# Patient Record
Sex: Female | Born: 1950 | Race: White | Hispanic: No | Marital: Married | Smoking: Never smoker
Health system: Southern US, Community
[De-identification: ages and names within clinical notes are randomized; demographics above are authoritative.]

## PROBLEM LIST (undated history)

## (undated) DIAGNOSIS — G4730 Sleep apnea, unspecified: Secondary | ICD-10-CM

## (undated) DIAGNOSIS — K219 Gastro-esophageal reflux disease without esophagitis: Secondary | ICD-10-CM

## (undated) DIAGNOSIS — E7800 Pure hypercholesterolemia, unspecified: Secondary | ICD-10-CM

## (undated) DIAGNOSIS — J309 Allergic rhinitis, unspecified: Secondary | ICD-10-CM

## (undated) DIAGNOSIS — H269 Unspecified cataract: Secondary | ICD-10-CM

## (undated) DIAGNOSIS — M8580 Other specified disorders of bone density and structure, unspecified site: Secondary | ICD-10-CM

## (undated) HISTORY — DX: Gastro-esophageal reflux disease without esophagitis: K21.9

## (undated) HISTORY — DX: Unspecified cataract: H26.9

## (undated) HISTORY — DX: Pure hypercholesterolemia, unspecified: E78.00

## (undated) HISTORY — DX: Sleep apnea, unspecified: G47.30

## (undated) HISTORY — DX: Allergic rhinitis, unspecified: J30.9

## (undated) HISTORY — DX: Other specified disorders of bone density and structure, unspecified site: M85.80

---

## 1999-01-26 ENCOUNTER — Other Ambulatory Visit: Admission: RE | Admit: 1999-01-26 | Discharge: 1999-01-26 | Payer: Self-pay | Admitting: Family Medicine

## 1999-03-27 ENCOUNTER — Encounter: Admission: RE | Admit: 1999-03-27 | Discharge: 1999-03-27 | Payer: Self-pay | Admitting: Family Medicine

## 1999-03-27 ENCOUNTER — Encounter: Payer: Self-pay | Admitting: Family Medicine

## 2000-02-20 ENCOUNTER — Other Ambulatory Visit: Admission: RE | Admit: 2000-02-20 | Discharge: 2000-02-20 | Payer: Self-pay | Admitting: *Deleted

## 2000-05-13 ENCOUNTER — Encounter: Admission: RE | Admit: 2000-05-13 | Discharge: 2000-05-13 | Payer: Self-pay | Admitting: Emergency Medicine

## 2000-05-13 ENCOUNTER — Encounter: Payer: Self-pay | Admitting: Emergency Medicine

## 2000-05-19 ENCOUNTER — Encounter: Payer: Self-pay | Admitting: Emergency Medicine

## 2000-05-19 ENCOUNTER — Encounter: Admission: RE | Admit: 2000-05-19 | Discharge: 2000-05-19 | Payer: Self-pay | Admitting: Emergency Medicine

## 2001-02-11 ENCOUNTER — Ambulatory Visit (HOSPITAL_COMMUNITY): Admission: RE | Admit: 2001-02-11 | Discharge: 2001-02-11 | Payer: Self-pay | Admitting: Internal Medicine

## 2001-08-25 ENCOUNTER — Encounter: Admission: RE | Admit: 2001-08-25 | Discharge: 2001-08-25 | Payer: Self-pay | Admitting: *Deleted

## 2002-08-19 ENCOUNTER — Encounter: Admission: RE | Admit: 2002-08-19 | Discharge: 2002-08-19 | Payer: Self-pay | Admitting: Family Medicine

## 2002-08-19 ENCOUNTER — Encounter: Payer: Self-pay | Admitting: Family Medicine

## 2003-01-21 ENCOUNTER — Other Ambulatory Visit: Admission: RE | Admit: 2003-01-21 | Discharge: 2003-01-21 | Payer: Self-pay | Admitting: Obstetrics and Gynecology

## 2003-02-14 ENCOUNTER — Encounter: Admission: RE | Admit: 2003-02-14 | Discharge: 2003-02-14 | Payer: Self-pay | Admitting: Family Medicine

## 2003-09-02 ENCOUNTER — Encounter: Admission: RE | Admit: 2003-09-02 | Discharge: 2003-09-02 | Payer: Self-pay | Admitting: Family Medicine

## 2004-01-24 ENCOUNTER — Other Ambulatory Visit: Admission: RE | Admit: 2004-01-24 | Discharge: 2004-01-24 | Payer: Self-pay | Admitting: Obstetrics and Gynecology

## 2004-09-10 ENCOUNTER — Encounter: Admission: RE | Admit: 2004-09-10 | Discharge: 2004-09-10 | Payer: Self-pay | Admitting: Family Medicine

## 2005-01-25 ENCOUNTER — Other Ambulatory Visit: Admission: RE | Admit: 2005-01-25 | Discharge: 2005-01-25 | Payer: Self-pay | Admitting: Obstetrics and Gynecology

## 2005-09-11 ENCOUNTER — Encounter: Admission: RE | Admit: 2005-09-11 | Discharge: 2005-09-11 | Payer: Self-pay | Admitting: Obstetrics and Gynecology

## 2006-09-10 ENCOUNTER — Encounter: Admission: RE | Admit: 2006-09-10 | Discharge: 2006-09-10 | Payer: Self-pay | Admitting: Obstetrics and Gynecology

## 2007-09-02 ENCOUNTER — Encounter: Admission: RE | Admit: 2007-09-02 | Discharge: 2007-09-02 | Payer: Self-pay | Admitting: Family Medicine

## 2008-07-25 LAB — HM COLONOSCOPY: HM Colonoscopy: NORMAL

## 2008-09-01 ENCOUNTER — Encounter: Admission: RE | Admit: 2008-09-01 | Discharge: 2008-09-01 | Payer: Self-pay | Admitting: Obstetrics and Gynecology

## 2009-09-04 ENCOUNTER — Encounter: Admission: RE | Admit: 2009-09-04 | Discharge: 2009-09-04 | Payer: Self-pay | Admitting: Obstetrics and Gynecology

## 2010-08-02 ENCOUNTER — Other Ambulatory Visit: Payer: Self-pay | Admitting: Obstetrics and Gynecology

## 2010-08-02 DIAGNOSIS — Z1231 Encounter for screening mammogram for malignant neoplasm of breast: Secondary | ICD-10-CM

## 2010-09-18 ENCOUNTER — Ambulatory Visit
Admission: RE | Admit: 2010-09-18 | Discharge: 2010-09-18 | Disposition: A | Discharge status: Home / Self Care | Payer: 59 | Source: Ambulatory Visit | Attending: Obstetrics and Gynecology | Admitting: Obstetrics and Gynecology

## 2010-09-18 DIAGNOSIS — Z1231 Encounter for screening mammogram for malignant neoplasm of breast: Secondary | ICD-10-CM

## 2010-09-18 LAB — HM MAMMOGRAPHY: HM Mammogram: NEGATIVE

## 2011-05-09 ENCOUNTER — Encounter: Payer: Self-pay | Admitting: Family Medicine

## 2011-05-09 ENCOUNTER — Ambulatory Visit (INDEPENDENT_AMBULATORY_CARE_PROVIDER_SITE_OTHER): Payer: 59 | Admitting: Family Medicine

## 2011-05-09 VITALS — BP 122/80 | HR 80 | Temp 98.7°F | Ht 63.25 in | Wt 165.0 lb

## 2011-05-09 DIAGNOSIS — E7800 Pure hypercholesterolemia, unspecified: Secondary | ICD-10-CM

## 2011-05-09 DIAGNOSIS — B354 Tinea corporis: Secondary | ICD-10-CM

## 2011-05-09 MED ORDER — CLOTRIMAZOLE 1 % EX CREA: TOPICAL_CREAM | Freq: Two times a day (BID) | CUTANEOUS | Status: AC

## 2011-05-09 NOTE — Patient Instructions (Signed)
Increase clotrimazole to using it twice daily.  You likely will need to use it for up to 2-3 weeks.  Try and avoid irritation as much as possible (exercise clothing), and keep as dry as possible

## 2011-05-09 NOTE — Progress Notes (Signed)
Chief complaint:  rash in inner thigh, kinda looks like a bruise. been there 5-6 weeks    HPI:  Noticed a "bruise" on L leg about 5-6 weeks, but it wasn't sore like a bruise.  After a couple of weeks, when it didn't go away, she realized it wasn't a bruise.  She had a fungal infection 2 years ago that started out the same way.  She started using an OTC antifungal cream once daily 3 weeks ago, but hasn't noticed any improvement.  Not changing in size. She continues to use baby powder before exercise since the last infection.  Started noticing a similar lesion on the right leg about 3 weeks ago, which has gotten a little larger.  Not pruritic.    Last CPE in January with GYN, who also did her labs and prescribes her Lipitor.    Past Medical History  Diagnosis Date  . Pure hypercholesterolemia   . GERD (gastroesophageal reflux disease)   . Allergic rhinitis, cause unspecified     seasonal    History reviewed. No pertinent past surgical history.  History   Social History  . Marital Status: Married    Spouse Name: N/A    Number of Children: N/A  . Years of Education: N/A   Occupational History  . benefits [employer]   Social History Main Topics  . Smoking status: Never Smoker   . Smokeless tobacco: Never Used  . Alcohol Use: Yes     1 drink a week  . Drug Use: No  . Sexually Active: Yes    Birth Control/ Protection: Post-menopausal   Other Topics Concern  . Not on file   Social History Narrative   Lives with husband.  2 sons locally ([place] and [employer]), 1 grandchild.  Plans to retire in 4 months.    Family History  Problem Relation Age of Onset  . Hyperlipidemia Mother   . Hypothyroidism Mother   . Parkinsonism Mother   . Colon polyps Mother   . Heart disease Father   . Depression Father   . Hypothyroidism Sister   . Allergies Brother   . Cancer Maternal Grandmother     in her back (bone?)  . Heart disease Maternal Grandfather   . Heart disease Paternal  Grandmother   . Diabetes Paternal Grandmother   . Heart disease Paternal Grandfather   . Hyperlipidemia Brother   . Colon polyps Brother   . Colon polyps Sister     Current outpatient prescriptions:atorvastatin (LIPITOR) 10 MG tablet, Take 10 mg by mouth daily., Disp: , Rfl: ;  Calcium Carbonate-Vitamin D (CALCIUM 500 + D PO), Take by mouth., Disp: , Rfl: ;  cetirizine (ZYRTEC) 10 MG tablet, Take 10 mg by mouth daily., Disp: , Rfl: ;  dextromethorphan (DELSYM) 30 MG/5ML liquid, Take 60 mg by mouth as needed., Disp: , Rfl:  esomeprazole (NEXIUM) 40 MG capsule, Take 40 mg by mouth daily before breakfast., Disp: , Rfl: ;  estradiol-norethindrone (ACTIVELLA) 1-0.5 MG per tablet, Take 1 tablet by mouth daily., Disp: , Rfl: ;  IRON PO, Take 500 mg by mouth., Disp: , Rfl: ;  vitamin C (ASCORBIC ACID) 500 MG tablet, Take 500 mg by mouth daily., Disp: , Rfl: ;  clotrimazole (LOTRIMIN) 1 % cream, Apply topically 2 (two) times daily., Disp: 45 g, Rfl: 1  Allergies no known allergies  ROS:  Denies headaches, myalgias, chest pain, fevers, URI symptoms, bleeding/bruising, other skin lesions or other concerns  PHYSICAL EXAM:  BP 122/80  Pulse 80  Temp(Src) 98.7 F (37.1 C) (Oral)  Ht 5' 3.25" (1.607 m)  Wt 165 lb (74.844 kg)  BMI 29.00 kg/m2 Well developed, pleasant female in no distress Skin: L inner thigh--area of mild erythema, slightly raised and hyperpigmentation measuring 5 cm by 1-1.5. On R inner thigh, just slightly superior to the lesion on left (not exactly "kissing lesions", there is similarly appearing lesions, but smaller, measuring 3.5 x 1 cm.  No central clearing, no satellite pustules.  Remainder of skin is normal  Neck: no lymphadenopathy Heart: regular rate and rhythm without murmur Lungs: clear bilaterally  ASSESSMENT/PLAN: 1. Tinea corporis  clotrimazole (LOTRIMIN) 1 % cream  2. Pure hypercholesterolemia     This doesn't look like classic fungal infection, but given  location/similar history in past, must suspect this.  Doesn't look typical due to partial treatment with antifungals. Therefore, full course of OTC treatment recommended (BID x 2-3 weeks).  F/u if persists/worsens  Briefly reviewed immunization recommendations--check TdaP (vs Td), and recommend zostavax at 60

## 2011-08-14 ENCOUNTER — Other Ambulatory Visit: Payer: Self-pay | Admitting: Obstetrics and Gynecology

## 2011-08-14 DIAGNOSIS — Z1231 Encounter for screening mammogram for malignant neoplasm of breast: Secondary | ICD-10-CM

## 2011-09-25 ENCOUNTER — Ambulatory Visit
Admission: RE | Admit: 2011-09-25 | Discharge: 2011-09-25 | Disposition: A | Discharge status: Home / Self Care | Payer: 59 | Source: Ambulatory Visit | Attending: Obstetrics and Gynecology | Admitting: Obstetrics and Gynecology

## 2011-09-25 DIAGNOSIS — Z1231 Encounter for screening mammogram for malignant neoplasm of breast: Secondary | ICD-10-CM

## 2011-09-26 ENCOUNTER — Other Ambulatory Visit: Payer: Self-pay | Admitting: Obstetrics and Gynecology

## 2011-09-26 DIAGNOSIS — R928 Other abnormal and inconclusive findings on diagnostic imaging of breast: Secondary | ICD-10-CM

## 2011-09-27 ENCOUNTER — Ambulatory Visit
Admission: RE | Admit: 2011-09-27 | Discharge: 2011-09-27 | Disposition: A | Discharge status: Home / Self Care | Payer: 59 | Source: Ambulatory Visit | Attending: Obstetrics and Gynecology | Admitting: Obstetrics and Gynecology

## 2011-09-27 ENCOUNTER — Other Ambulatory Visit: Payer: Self-pay | Admitting: Obstetrics and Gynecology

## 2011-09-27 DIAGNOSIS — R928 Other abnormal and inconclusive findings on diagnostic imaging of breast: Secondary | ICD-10-CM

## 2011-10-01 ENCOUNTER — Encounter: Payer: Self-pay | Admitting: Internal Medicine

## 2011-10-09 ENCOUNTER — Other Ambulatory Visit (INDEPENDENT_AMBULATORY_CARE_PROVIDER_SITE_OTHER): Payer: 59

## 2011-10-09 DIAGNOSIS — Z23 Encounter for immunization: Secondary | ICD-10-CM

## 2012-02-17 ENCOUNTER — Other Ambulatory Visit: Payer: Self-pay | Admitting: Obstetrics and Gynecology

## 2012-02-17 DIAGNOSIS — N6009 Solitary cyst of unspecified breast: Secondary | ICD-10-CM

## 2012-03-27 ENCOUNTER — Ambulatory Visit
Admission: RE | Admit: 2012-03-27 | Discharge: 2012-03-27 | Disposition: A | Discharge status: Home / Self Care | Payer: BC Managed Care – PPO | Source: Ambulatory Visit | Attending: Obstetrics and Gynecology | Admitting: Obstetrics and Gynecology

## 2012-03-27 DIAGNOSIS — N6009 Solitary cyst of unspecified breast: Secondary | ICD-10-CM

## 2012-06-11 ENCOUNTER — Encounter: Payer: Self-pay | Admitting: Family Medicine

## 2012-06-11 ENCOUNTER — Ambulatory Visit (INDEPENDENT_AMBULATORY_CARE_PROVIDER_SITE_OTHER): Payer: BC Managed Care – PPO | Admitting: Family Medicine

## 2012-06-11 VITALS — BP 128/80 | HR 68 | Temp 98.1°F | Ht 63.5 in | Wt 162.0 lb

## 2012-06-11 DIAGNOSIS — J309 Allergic rhinitis, unspecified: Secondary | ICD-10-CM

## 2012-06-11 DIAGNOSIS — J069 Acute upper respiratory infection, unspecified: Secondary | ICD-10-CM

## 2012-06-11 MED ORDER — AMOXICILLIN 875 MG PO TABS: 875.0000 mg | ORAL_TABLET | Freq: Two times a day (BID) | ORAL | Status: DC

## 2012-06-11 MED ORDER — GUAIFENESIN ER 1200 MG PO TB12: 1.0000 | ORAL_TABLET | Freq: Two times a day (BID) | ORAL | Status: DC | PRN

## 2012-06-11 NOTE — Progress Notes (Signed)
Chief Complaint  Patient presents with  . cold    head cold and cough, coughing bringing up yellow mucous sometimes for about 2 weeks   13 days ago she started with coughing and sneezing.  Then developed sinus headaches, blowing her nose a lot.  It started to loosen up, but hasn't resolved.  She is using sudafed during day, nyquil at night.  Sinus headaches resolved. Nasal mucus is clear.  Coughing up phlegm all day long, yellow in color, not particularly thick.  Denies shortness of breath, tightness or wheezing, no chest pain.  Denies fevers.  Denies sick contacts. Her eye allergies have been bothering her, using drops, but not taking any zyrtec.  She had similar illness this time last year, but it didn't last as long.  Past Medical History  Diagnosis Date  . Pure hypercholesterolemia   . GERD (gastroesophageal reflux disease)   . Allergic rhinitis, cause unspecified     seasonal   History reviewed. No pertinent past surgical history. History   Social History  . Marital Status: Married    Spouse Name: N/A    Number of Children: N/A  . Years of Education: N/A   Occupational History  . 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 Smoking status: Never Smoker   . Smokeless tobacco: Never Used  . Alcohol Use: Yes     Comment: 1 drink a week  . Drug Use: No  . Sexually Active: Yes    Birth Control/ Protection: Post-menopausal   Other Topics Concern  . Not on file   Social History Narrative   Lives with husband.  2 sons locally ([place] and [employer]), 1 grandchild.  Retired, but working part-time at [employer].  Husband is a smoker, but he doesn't smoke in car or in house.   Current Outpatient Prescriptions on File Prior to Visit  Medication Sig Dispense Refill  . atorvastatin (LIPITOR) 10 MG tablet Take 10 mg by mouth daily.      . Calcium Carbonate-Vitamin D (CALCIUM 500 + D PO) Take by mouth.      . dextromethorphan (DELSYM) 30 MG/5ML liquid  Take 60 mg by mouth as needed.      Marland Kitchen esomeprazole (NEXIUM) 40 MG capsule Take 40 mg by mouth daily before breakfast.      . estradiol-norethindrone (ACTIVELLA) 1-0.5 MG per tablet Take 1 tablet by mouth daily.      . IRON PO Take 500 mg by mouth.      . vitamin C (ASCORBIC ACID) 500 MG tablet Take 500 mg by mouth daily.      . cetirizine (ZYRTEC) 10 MG tablet Take 10 mg by mouth daily.       No current facility-administered medications on file prior to visit.   No Known Allergies  ROS: denies fevers, headaches, chest pain, shortness of breath, nausea, vomiting, diarrhea, bowel changes, urinary complaints, skin rashes, bleeding/bruising or other complaints.  See HPI.  Denies reflux currently  PHYSICAL EXAM: BP 128/80  Pulse 68  Temp(Src) 98.1 F (36.7 C) (Oral)  Ht 5' 3.5" (1.613 m)  Wt 162 lb (73.483 kg)  BMI 28.24 kg/m2 Well developed female, in no distress.  No significant cough/sniffling/congestion noted HEENT:  PERRL, EOMI, conjunctiva clear.  TM's and EAC's normal.  Nasal mucosa moderately edematous, not pale or particularly red.  Clear mucus with slight yellow crust at nares opening.  Sinuses nontender.  OP clear Neck: no lymphadenopathy or mass Heart:  regular rate and rhythm without murmur Lungs: clear bilaterally.  Good air movement. No wheezes, rales or ronchi Skin: no rash Neuro: alert and oriented. Cranial nerves intact.  Normal gait.  ASSESSMENT/PLAN:  Acute upper respiratory infections of unspecified site - Plan: amoxicillin (AMOXIL) 875 MG tablet, Guaifenesin 1200 MG TB12  Allergic rhinitis, cause unspecified  URI vs allergies.  Given length of symptoms, but not progressive, likely has underlying allergy component.  Add in Zyrtec daily.  Try using mucinex twice daily (and delsym syrup just as needed for cough).  Consider sinus rinses if having increasing sinus pressure/pain.  If discolored phlegm persists, start (and finish) antibiotics

## 2012-06-11 NOTE — Patient Instructions (Signed)
  URI (cold) vs allergies.  Given length of symptoms, but not progressive, likely has underlying allergy component.  Add in Zyrtec daily.  Try using mucinex twice daily (and delsym syrup just as needed for cough).  Consider sinus rinses if having increasing sinus pressure/pain.  If discolored phlegm persists, start (and finish) antibiotics

## 2012-08-17 ENCOUNTER — Other Ambulatory Visit: Payer: Self-pay

## 2012-08-17 DIAGNOSIS — Z1231 Encounter for screening mammogram for malignant neoplasm of breast: Secondary | ICD-10-CM

## 2012-09-30 ENCOUNTER — Ambulatory Visit
Admission: RE | Admit: 2012-09-30 | Discharge: 2012-09-30 | Disposition: A | Discharge status: Home / Self Care | Payer: BC Managed Care – PPO | Source: Ambulatory Visit

## 2012-09-30 DIAGNOSIS — Z1231 Encounter for screening mammogram for malignant neoplasm of breast: Secondary | ICD-10-CM

## 2012-10-20 ENCOUNTER — Other Ambulatory Visit: Payer: Self-pay | Admitting: Dermatology

## 2013-01-14 HISTORY — PX: COLONOSCOPY: SHX174

## 2013-01-15 ENCOUNTER — Ambulatory Visit (INDEPENDENT_AMBULATORY_CARE_PROVIDER_SITE_OTHER): Payer: BC Managed Care – PPO | Admitting: Medical

## 2013-01-15 ENCOUNTER — Encounter: Payer: Self-pay | Admitting: Medical

## 2013-01-15 VITALS — BP 120/80 | HR 78 | Temp 97.9°F | Resp 16 | Wt 169.0 lb

## 2013-01-15 DIAGNOSIS — R059 Cough, unspecified: Secondary | ICD-10-CM

## 2013-01-15 DIAGNOSIS — J329 Chronic sinusitis, unspecified: Secondary | ICD-10-CM

## 2013-01-15 DIAGNOSIS — R05 Cough: Secondary | ICD-10-CM

## 2013-01-15 MED ORDER — AMOXICILLIN 875 MG PO TABS: 875.0000 mg | ORAL_TABLET | Freq: Two times a day (BID) | ORAL | Status: DC

## 2013-01-15 NOTE — Progress Notes (Signed)
Subjective:  Kelli Zavala is a 63 y.o. female who presents for illness.  Her husband is here for the same today..  Symptoms include two-week history of cough, productive sputum, ear pain, sore throat, sinus pressure, teeth pain..  Denies fever, nausea vomiting diarrhea, shortness of breath, wheezing, abdominal pain. Patient is a non-smoker.  Using Delsym NyQuil for symptoms.  No other aggravating or relieving factors.  No other c/o.  ROS as in subjective   Objective: Filed Vitals:   01/15/13 1051  BP: 120/80  Pulse: 78  Temp: 97.9 F (36.6 C)  Resp: 16    General appearance: Alert, WD/WN, no distress                             Skin: warm, no rash                           Head: + Maxillary sinus tenderness,                            Eyes: conjunctiva normal, corneas clear, PERRLA                            Ears: pearly TMs, external ear canals normal                          Nose: septum midline, turbinates swollen, with erythema and clear discharge             Mouth/throat: MMM, tongue normal, mild pharyngeal erythema                           Neck: supple, no adenopathy, no thyromegaly, nontender                          Heart: RRR, normal S1, S2, no murmurs                         Lungs: CTA bilaterally, no wheezes, rales, or rhonchi      Assessment and Plan:   Encounter Diagnoses  Name Primary?  . Sinusitis Yes  . Cough      Prescription given for amoxicillin.  Can continue OTC Mucinex for congestion.  Tylenol or Ibuprofen OTC for fever and malaise.  Discussed symptomatic relief, nasal saline flush, and call or return if worse or not improving in 4-5 days.

## 2013-08-24 ENCOUNTER — Other Ambulatory Visit: Payer: Self-pay

## 2013-08-24 DIAGNOSIS — Z1231 Encounter for screening mammogram for malignant neoplasm of breast: Secondary | ICD-10-CM

## 2013-10-04 LAB — HM COLONOSCOPY: HM Colonoscopy: NORMAL

## 2013-10-06 ENCOUNTER — Encounter (INDEPENDENT_AMBULATORY_CARE_PROVIDER_SITE_OTHER): Payer: Self-pay

## 2013-10-06 ENCOUNTER — Ambulatory Visit
Admission: RE | Admit: 2013-10-06 | Discharge: 2013-10-06 | Disposition: A | Discharge status: Home / Self Care | Payer: BC Managed Care – PPO | Source: Ambulatory Visit

## 2013-10-06 DIAGNOSIS — Z1231 Encounter for screening mammogram for malignant neoplasm of breast: Secondary | ICD-10-CM

## 2013-10-11 ENCOUNTER — Encounter: Payer: Self-pay | Admitting: *Deleted

## 2014-03-09 LAB — HM PAP SMEAR: HM PAP: NEGATIVE

## 2014-09-08 ENCOUNTER — Other Ambulatory Visit: Payer: Self-pay

## 2014-09-08 DIAGNOSIS — Z1231 Encounter for screening mammogram for malignant neoplasm of breast: Secondary | ICD-10-CM

## 2014-10-10 ENCOUNTER — Ambulatory Visit
Admission: RE | Admit: 2014-10-10 | Discharge: 2014-10-10 | Disposition: A | Discharge status: Home / Self Care | Payer: Commercial Managed Care - HMO | Source: Ambulatory Visit

## 2014-10-10 DIAGNOSIS — Z1231 Encounter for screening mammogram for malignant neoplasm of breast: Secondary | ICD-10-CM

## 2015-01-17 ENCOUNTER — Ambulatory Visit: Payer: Self-pay | Admitting: Family Medicine

## 2015-01-17 ENCOUNTER — Encounter: Payer: Self-pay | Admitting: Family Medicine

## 2015-01-17 ENCOUNTER — Ambulatory Visit (INDEPENDENT_AMBULATORY_CARE_PROVIDER_SITE_OTHER): Payer: Commercial Managed Care - HMO | Admitting: Family Medicine

## 2015-01-17 VITALS — BP 116/76 | Temp 98.9°F | Ht 68.0 in | Wt 168.0 lb

## 2015-01-17 DIAGNOSIS — H2000 Unspecified acute and subacute iridocyclitis: Secondary | ICD-10-CM

## 2015-01-17 DIAGNOSIS — J0110 Acute frontal sinusitis, unspecified: Secondary | ICD-10-CM

## 2015-01-17 MED ORDER — AMOXICILLIN 875 MG PO TABS: 875.0000 mg | ORAL_TABLET | Freq: Two times a day (BID) | ORAL | Status: DC

## 2015-01-17 NOTE — Progress Notes (Signed)
   Subjective:    Patient ID: Kelli Zavala, female    DOB: 21-Aug-1950, 65 y.o.   MRN: [id number]  HPI  she complains of a two-week history of difficulty with sore throat, nasal congestion , PND with coughing. In the last several days she had also noted redness especially in the right eye with initially some yellowish drainage and now clear drainage. She also complains of some slight right eye pain as well as photophobia.   Review of Systems     Objective:   Physical Exam Alert and in no distress.  Right conjunctiva is slightly swollen. The orbit is tender to palpation. No drainage is noted. Nasal mucosa is slightly red with slight tenderness over  frontal sinuses.Tympanic membranes and canals are normal. Pharyngeal area is normal. Neck is supple without adenopathy or thyromegaly. Cardiac exam shows a regular sinus rhythm without murmurs or gallops. Lungs are clear to auscultation.        Assessment & Plan:  Iritis acute or subacute - Plan: Ambulatory referral to Ophthalmology  Acute frontal sinusitis, recurrence not specified - Plan: amoxicillin (AMOXIL) 875 MG tablet

## 2015-03-09 DIAGNOSIS — K219 Gastro-esophageal reflux disease without esophagitis: Secondary | ICD-10-CM | POA: Insufficient documentation

## 2015-06-13 ENCOUNTER — Encounter: Payer: Self-pay | Admitting: Family Medicine

## 2015-06-13 ENCOUNTER — Ambulatory Visit (INDEPENDENT_AMBULATORY_CARE_PROVIDER_SITE_OTHER): Payer: Commercial Managed Care - HMO | Admitting: Family Medicine

## 2015-06-13 VITALS — BP 120/78 | HR 76 | Wt 170.6 lb

## 2015-06-13 DIAGNOSIS — S93402A Sprain of unspecified ligament of left ankle, initial encounter: Secondary | ICD-10-CM | POA: Diagnosis not present

## 2015-06-13 DIAGNOSIS — IMO0001 Reserved for inherently not codable concepts without codable children: Secondary | ICD-10-CM

## 2015-06-13 NOTE — Patient Instructions (Signed)
Right the ABCs with your big toe and call me in a month if not better

## 2015-06-14 NOTE — Progress Notes (Signed)
   Subjective:    Patient ID: Kelli Zavala, female    DOB: 1950/03/19, 65 y.o.   MRN: [id number]  HPI Ablation of an ankle sprain that occurred 4 weeks ago. She describes an inversion type injury. She was able to walk after that. She did not seek medical care at that time. She did fairly well until recently when she tried to wear high heel shoes and again had pain in the left ankle.   Review of Systems     Objective:   Physical Exam Left ankle exam shows no swelling with good motion. No tenderness over lateral malleolus, and the fifth metatarsal or over ATF. She did show a slighty positive anterior drawer.       Assessment & Plan:  Second degree ankle sprain, left, initial encounter I discussed the slight laxity of her ankle with her and encouraged her to do strengthening exercises. I demonstrated the exercises to her. I will recheck her in approximately 2 weeks to reevaluate this.

## 2015-07-03 ENCOUNTER — Encounter: Payer: Self-pay | Admitting: Family Medicine

## 2015-07-03 ENCOUNTER — Ambulatory Visit (INDEPENDENT_AMBULATORY_CARE_PROVIDER_SITE_OTHER): Payer: Commercial Managed Care - HMO | Admitting: Family Medicine

## 2015-07-03 VITALS — BP 118/76 | HR 68 | Ht 64.0 in | Wt 166.8 lb

## 2015-07-03 DIAGNOSIS — K219 Gastro-esophageal reflux disease without esophagitis: Secondary | ICD-10-CM

## 2015-07-03 DIAGNOSIS — E7800 Pure hypercholesterolemia, unspecified: Secondary | ICD-10-CM | POA: Diagnosis not present

## 2015-07-03 DIAGNOSIS — Z5181 Encounter for therapeutic drug level monitoring: Secondary | ICD-10-CM | POA: Diagnosis not present

## 2015-07-03 DIAGNOSIS — R5383 Other fatigue: Secondary | ICD-10-CM

## 2015-07-03 LAB — COMPREHENSIVE METABOLIC PANEL
ALBUMIN: 4.6 g/dL (ref 3.6–5.1)
ALT: 14 U/L (ref 6–29)
AST: 16 U/L (ref 10–35)
Alkaline Phosphatase: 83 U/L (ref 33–130)
BILIRUBIN TOTAL: 1.2 mg/dL (ref 0.2–1.2)
BUN: 16 mg/dL (ref 7–25)
CO2: 27 mmol/L (ref 20–31)
Calcium: 9.7 mg/dL (ref 8.6–10.4)
Chloride: 102 mmol/L (ref 98–110)
Creat: 0.75 mg/dL (ref 0.50–0.99)
Glucose, Bld: 80 mg/dL (ref 65–99)
Potassium: 4.2 mmol/L (ref 3.5–5.3)
SODIUM: 139 mmol/L (ref 135–146)
TOTAL PROTEIN: 7.1 g/dL (ref 6.1–8.1)

## 2015-07-03 LAB — CBC WITH DIFFERENTIAL/PLATELET
BASOS ABS: 0 {cells}/uL (ref 0–200)
Basophils Relative: 0 %
EOS ABS: 92 {cells}/uL (ref 15–500)
Eosinophils Relative: 2 %
HCT: 43.1 % (ref 35.0–45.0)
Hemoglobin: 14.3 g/dL (ref 11.7–15.5)
LYMPHS PCT: 38 %
Lymphs Abs: 1748 cells/uL (ref 850–3900)
MCH: 30 pg (ref 27.0–33.0)
MCHC: 33.2 g/dL (ref 32.0–36.0)
MCV: 90.4 fL (ref 80.0–100.0)
MONOS PCT: 6 %
MPV: 11 fL (ref 7.5–12.5)
Monocytes Absolute: 276 cells/uL (ref 200–950)
Neutro Abs: 2484 cells/uL (ref 1500–7800)
Neutrophils Relative %: 54 %
Platelets: 183 10*3/uL (ref 140–400)
RBC: 4.77 MIL/uL (ref 3.80–5.10)
RDW: 12.8 % (ref 11.0–15.0)
WBC: 4.6 10*3/uL (ref 4.0–10.5)

## 2015-07-03 LAB — LIPID PANEL
CHOL/HDL RATIO: 2.6 ratio (ref <=5.0)
Cholesterol: 167 mg/dL (ref 125–200)
HDL: 64 mg/dL (ref >=46)
LDL Cholesterol: 82 mg/dL (ref <130)
Triglycerides: 107 mg/dL (ref <150)
VLDL: 21 mg/dL (ref <30)

## 2015-07-03 LAB — MAGNESIUM: Magnesium: 2 mg/dL (ref 1.5–2.5)

## 2015-07-03 LAB — TSH: TSH: 2.85 m[IU]/L

## 2015-07-03 NOTE — Progress Notes (Signed)
Chief Complaint  Patient presents with  . Medication Management    request management of lipitor and labs. had annual female exam in Feb 2017. fasting   Her GYN (Dr. Harrington Challenger) retired in July.  New GYN wants her to be seen here for her labs, medications. She previously had her cholesterol medication and labs done through Dr. Harrington Challenger.  No records available. She saw the replacement GYN, but she wasn't willing to continue to do some of the things Dr. Harrington Challenger did.  Pap smear wasn't done this year, nor was a urine check. She denies any urinary symptoms. Sees Dr. Delman Cheadle yearly for routine skin checks. She no longer sees specialist for GERD, since doing well on OTC Nexium. She needs to take Delsym once daily in the morning, along with some cough drops, as this was recommended by specialist in the past, and continues to be effective for her.  Past Medical History  Diagnosis Date  . Pure hypercholesterolemia   . GERD (gastroesophageal reflux disease)   . Allergic rhinitis, cause unspecified     seasonal    History reviewed. No pertinent past surgical history.  Social History   Social History  . Marital Status: Married    Spouse Name: N/A  . Number of Children: N/A  . Years of Education: N/A   Occupational History  . 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 Smoking status: Never Smoker   . Smokeless tobacco: Never Used  . Alcohol Use: Yes     Comment: 1 drink a week  . Drug Use: No  . Sexual Activity: Yes    Birth Control/ Protection: Post-menopausal   Other Topics Concern  . Not on file   Social History Narrative   Lives with husband.  2 sons locally ([place] and [place]), 1 grandchild.  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  Husband is a smoker, but he doesn't smoke in car or in house.    Family History  Problem Relation Age of Onset  . Hyperlipidemia Mother   . Hypothyroidism Mother    . Parkinsonism Mother   . Colon polyps Mother   . Heart disease Mother 86    CABG x 2  . Dementia Mother     mild  . Heart disease Father   . Depression Father   . Hypothyroidism Sister   . Allergies Brother   . Cancer Maternal Grandmother     in her back (bone?)  . Heart disease Maternal Grandfather   . Heart disease Paternal Grandmother   . Diabetes Paternal Grandmother   . Heart disease Paternal Grandfather   . Colon polyps Brother   . Colon polyps Sister   . Hypothyroidism Sister    Outpatient Encounter Prescriptions as of 07/03/2015  Medication Sig Note  . atorvastatin (LIPITOR) 10 MG tablet Take 10 mg by mouth daily.   . Calcium Carbonate-Vitamin D (CALCIUM 500 + D PO) Take by mouth.   . cetirizine (ZYRTEC) 10 MG tablet Take 10 mg by mouth daily. 07/03/2015: Takes 1/2 tablet daily  . dextromethorphan (DELSYM) 30 MG/5ML liquid Take 60 mg by mouth as needed. 07/03/2015: Uses this daily (as recommended by specialist she saw for reflux)  . Esomeprazole Magnesium (NEXIUM 24HR PO) Take 1 capsule by mouth daily. 07/03/2015: Takes OTC Nexium daily  . IRON PO Take 500 mg by mouth. 07/03/2015: She donates blood every 8 weeks  . vitamin C (  ASCORBIC ACID) 500 MG tablet Take 500 mg by mouth daily.   . [DISCONTINUED] esomeprazole (NEXIUM) 40 MG capsule Take 40 mg by mouth daily before breakfast.    No facility-administered encounter medications on file as of 07/03/2015.    No Known Allergies  Immunization History  Administered Date(s) Administered  . Tdap 10/09/2011  . Zoster 10/09/2011   She gets flu shots yearly at work (the city gives for free).   ROS:  The patient denies anorexia, fever, weight changes, headaches,  vision changes, decreased hearing, ear pain, sore throat, chest pain, palpitations, dizziness, syncope, dyspnea on exertion, cough, swelling, nausea, vomiting, diarrhea, constipation, abdominal pain, melena, hematochezia, indigestion/heartburn, urinary complaints, joint  pains, numbness, tingling, weakness, tremor, suspicious skin lesions, depression, anxiety, abnormal bleeding/bruising, or enlarged lymph nodes.   PHYSICAL EXAM:  Well developed, well nourished patient, in no distress BP 118/76 mmHg  Pulse 68  Ht 5' 4" (1.626 m)  Wt 166 lb 12.8 oz (75.66 kg)  BMI 28.62 kg/m2  HEENT: PERRL, EOMi, conjunctiva and sclera are clear, OP clear Neck: No lymphadenopathy or thyromegaly, no carotid bruit Heart:  Regular rate and rhythm, no murmurs, rubs, gallops or ectopy Lungs:  Clear bilaterally, without wheezes, rales or ronchi Abdomen:  Soft, nontender, nondistended, no hepatosplenomegaly or masses, normal bowel sounds Extremities:  No clubbing, cyanosis or edema, 2+ pulses.  Neuro:  Alert and oriented x 3, cranial nerves grossly intact.  DTR's 2+ and symmetric.  Normal strength and sensation Back:  No spine or CVA tenderness Skin: no rashes or suspicious lesions Psych:  Normal mood, affect, hygiene and grooming, normal speech, eye contact  ASSESSMENT/PLAN:   Pure hypercholesterolemia - Plan: Lipid panel  Medication monitoring encounter - Plan: CBC with Differential/Platelet, Comprehensive metabolic panel, Magnesium  Other fatigue - Plan: CBC with Differential/Platelet, Comprehensive metabolic panel, VITAMIN D 25 Hydroxy (Vit-D Deficiency, Fractures), TSH  Gastroesophageal reflux disease without esophagitis - controlled with OTC Nexium    CBC, c-met, lipids, Vitamin D, TSH (hep C not done--donates blood regularly). Discussed longterm risks of PPI's, proper sun protection, calcium and Vitamin D recommendations.  Got 90d supply of Lipitor refilled last month from GYN. Still wants 1 year supply after labs back--90d rx

## 2015-07-03 NOTE — Patient Instructions (Addendum)
Continue yearly flu shots. Next other vaccines will be at age 65 (prevnar-13, followed by a pneumovax at age 65). Consider changing the timing of your vitamin C--take it along with your iron, to hep your body absorb the iron better.

## 2015-07-04 ENCOUNTER — Encounter: Payer: Self-pay | Admitting: Family Medicine

## 2015-07-04 LAB — VITAMIN D 25 HYDROXY (VIT D DEFICIENCY, FRACTURES): Vit D, 25-Hydroxy: 47 ng/mL (ref 30–100)

## 2015-07-04 MED ORDER — ATORVASTATIN CALCIUM 10 MG PO TABS: 10.0000 mg | ORAL_TABLET | Freq: Every day | ORAL | Status: DC

## 2015-09-11 ENCOUNTER — Other Ambulatory Visit: Payer: Self-pay | Admitting: Family Medicine

## 2015-09-11 DIAGNOSIS — Z1231 Encounter for screening mammogram for malignant neoplasm of breast: Secondary | ICD-10-CM

## 2015-10-09 ENCOUNTER — Encounter: Payer: Self-pay | Admitting: *Deleted

## 2015-10-13 ENCOUNTER — Ambulatory Visit
Admission: RE | Admit: 2015-10-13 | Discharge: 2015-10-13 | Disposition: A | Discharge status: Home / Self Care | Payer: Commercial Managed Care - HMO | Source: Ambulatory Visit | Attending: Family Medicine | Admitting: Family Medicine

## 2015-10-13 DIAGNOSIS — Z1231 Encounter for screening mammogram for malignant neoplasm of breast: Secondary | ICD-10-CM

## 2016-02-05 ENCOUNTER — Encounter: Payer: Self-pay | Admitting: Family Medicine

## 2016-02-05 ENCOUNTER — Ambulatory Visit (INDEPENDENT_AMBULATORY_CARE_PROVIDER_SITE_OTHER): Payer: Commercial Managed Care - HMO | Admitting: Family Medicine

## 2016-02-05 VITALS — BP 130/76 | HR 72 | Temp 98.5°F | Ht 68.0 in | Wt 169.0 lb

## 2016-02-05 DIAGNOSIS — R05 Cough: Secondary | ICD-10-CM | POA: Diagnosis not present

## 2016-02-05 DIAGNOSIS — K219 Gastro-esophageal reflux disease without esophagitis: Secondary | ICD-10-CM

## 2016-02-05 DIAGNOSIS — R059 Cough, unspecified: Secondary | ICD-10-CM

## 2016-02-05 MED ORDER — BENZONATATE 200 MG PO CAPS: 200.0000 mg | ORAL_CAPSULE | Freq: Three times a day (TID) | ORAL | 0 refills | Status: DC | PRN

## 2016-02-05 NOTE — Patient Instructions (Addendum)
  Drink plenty of water. Start taking guaifenesin (I recommend Mucinex). You may continue the delsym, if needed. You may also try the benzonatate instead (and can use both together if cough is severe, and neither cough medicine alone is effective). You may continue the Nyquil at bedtime, if needed.  Return here if you develop fever, shortness of breath, pain with breathing, or other concerns. If cough persists/worsens (but NO fever, pain with breathing or shortness of breath), especially if phlegm remains discolored and doesn't clear up as expected, then you can call for a prescription.  Consider doubling up on the Nexium for a few days--reflux exacerbation can cause dry cough, throat-clearing, hoarseness.

## 2016-02-05 NOTE — Progress Notes (Signed)
Chief Complaint  Patient presents with  . Cough    no drainage, not a real productive cough. Mucus is clear, no fevers. Started Thursday. No body aches or chills.     4 day ago she started with cough.  No associated URI symptoms.  Has throat-clearing; no runny nose or head congestion.  Denies heartburn.  Cough is mostly dry, sometimes productive a small amount of yellow phlegm. Cough has gotten a little worse over the last couple of days.  Denies shortness of breath, pleuritic chest pain. No sick contacts.  She takes Delsym every morning (recommended by her ENT for reflux). She continues to take Nexium daily, feels like reflux is well controlled. Uses non-mentholated cough drops prn She has used Nyquil the last couple of nights, helped some.  PMH, PSH, SH reviewed  Outpatient Encounter Prescriptions as of 02/05/2016  Medication Sig Note  . atorvastatin (LIPITOR) 10 MG tablet Take 1 tablet (10 mg total) by mouth daily.   . Calcium Carbonate-Vitamin D (CALCIUM 500 + D PO) Take by mouth.   . dextromethorphan (DELSYM) 30 MG/5ML liquid Take 60 mg by mouth as needed. 07/03/2015: Uses this daily (as recommended by specialist she saw for reflux)  . Esomeprazole Magnesium (NEXIUM 24HR PO) Take 1 capsule by mouth daily. 07/03/2015: Takes OTC Nexium daily  . IRON PO Take 500 mg by mouth. 07/03/2015: She donates blood every 8 weeks  . Phenyleph-Doxylamine-DM-APAP (NYQUIL SEVERE COLD/FLU) 5-6.25-10-325 MG/15ML LIQD Take 30 mLs by mouth at bedtime as needed.   . vitamin C (ASCORBIC ACID) 500 MG tablet Take 500 mg by mouth daily.   . cetirizine (ZYRTEC) 10 MG tablet Take 10 mg by mouth daily. 07/03/2015: Takes 1/2 tablet daily   No facility-administered encounter medications on file as of 02/05/2016.    No Known Allergies  ROS: Denies fever, chills, nausea, vomiting, heartburn, bleeding, bruising, rash, chest pain, joint aches, body aches or other complaints except as noted in HPI.  PHYSICAL EXAM:  BP  130/76 (BP Location: Left Arm, Patient Position: Sitting, Cuff Size: Normal)   Pulse 72   Temp 98.5 F (36.9 C) (Tympanic)   Ht 5\' 8"  (1.727 m)   Wt 169 lb (76.7 kg)   BMI 25.70 kg/m   Well developed, pleasant female in no distress. Frequent throat-clearing, and occasional dry cough HEENT: PERRL, EOMI, conjunctiva and sclera are clear. TM's and EAC's normal.  Nasal mucosa is mildly edematous, scant clear mucus noted on the right. OP is clear, sinuses are nontender Slightly shotty anterior cervical lymphadenopathy, nontender Heart: regular rate and rhythm without murmur Lungs: clear bilaterally, no wheezes, rales, ronchi, good air movement. Extremities: no edema Skin: normal turgor, no rash Psych: normal mood, affect, hygiene and grooming Neuro: alert and oriented, cranial nerves intact, normal strength, gait  ASSESSMENT/PLAN:  Cough - may have component of URI (viral) vs reflux. Add mucinex, tessalon prn - Plan: benzonatate (TESSALON) 200 MG capsule  Gastroesophageal reflux disease, esophagitis presence not specified - continue Nexium daily--if cough (nonproductive) persists, trial of doubling up    Drink plenty of water. Start taking guaifenesin (I recommend Mucinex). You may continue the delsym, if needed. You may also try the benzonatate instead (and can use both together if cough is severe, and neither cough medicine alone is effective). You may continue the Nyquil at bedtime, if needed.  Return here if you develop fever, shortness of breath, pain with breathing, or other concerns. If cough persists/worsens (but NO fever, pain with breathing or  shortness of breath), especially if phlegm remains discolored and doesn't clear up as expected, then you can call for a prescription.  Consider doubling up on the Nexium for a few days--reflux exacerbation can cause dry cough, throat-clearing, hoarseness.

## 2016-02-08 ENCOUNTER — Encounter: Payer: Self-pay | Admitting: Family Medicine

## 2016-02-08 MED ORDER — AZITHROMYCIN 250 MG PO TABS: ORAL_TABLET | ORAL | 0 refills | Status: DC

## 2016-03-08 ENCOUNTER — Ambulatory Visit (INDEPENDENT_AMBULATORY_CARE_PROVIDER_SITE_OTHER): Payer: Commercial Managed Care - HMO | Admitting: Medical

## 2016-03-08 VITALS — BP 130/80 | HR 78 | Temp 98.1°F | Wt 168.8 lb

## 2016-03-08 DIAGNOSIS — R350 Frequency of micturition: Secondary | ICD-10-CM

## 2016-03-08 DIAGNOSIS — R300 Dysuria: Secondary | ICD-10-CM | POA: Diagnosis not present

## 2016-03-08 LAB — POCT URINALYSIS DIPSTICK
Bilirubin, UA: NEGATIVE
Blood, UA: NEGATIVE
Glucose, UA: NEGATIVE
Ketones, UA: NEGATIVE
NITRITE UA: NEGATIVE
PH UA: 7
PROTEIN UA: NEGATIVE
Spec Grav, UA: 1.01
Urobilinogen, UA: NEGATIVE

## 2016-03-08 MED ORDER — CIPROFLOXACIN HCL 500 MG PO TABS: 500.0000 mg | ORAL_TABLET | Freq: Two times a day (BID) | ORAL | 0 refills | Status: DC

## 2016-03-08 NOTE — Progress Notes (Signed)
Subjective:  Kelli Zavala is a 66 y.o. female who complains of possible urinary tract infection.  She has had symptoms for a few hours.  Symptoms include burning with urination, urgency, some frequency.  No blood in urine, maybe mild urine odor.  No blood on tissue.  No reason to have UTI, not holding urine for long time, no change in hygeine products.  Drinks a good amount of water.   No recent travel.   No belly or back pain.  No fever, no NVD.   No vaginal c/o. Married.  No other aggravating or relieving factors.  No other c/o.  Past Medical History:  Diagnosis Date  . Allergic rhinitis, cause unspecified    seasonal  . GERD (gastroesophageal reflux disease)   . Pure hypercholesterolemia    Current Outpatient Prescriptions on File Prior to Visit  Medication Sig Dispense Refill  . atorvastatin (LIPITOR) 10 MG tablet Take 1 tablet (10 mg total) by mouth daily. 90 tablet 3  . Calcium Carbonate-Vitamin D (CALCIUM 500 + D PO) Take by mouth.    . Esomeprazole Magnesium (NEXIUM 24HR PO) Take 1 capsule by mouth daily.    . IRON PO Take 500 mg by mouth.    . vitamin C (ASCORBIC ACID) 500 MG tablet Take 500 mg by mouth daily.    . cetirizine (ZYRTEC) 10 MG tablet Take 10 mg by mouth daily.     No current facility-administered medications on file prior to visit.    ROS as in subjective  Reviewed allergies, medications, past medical, surgical, and social history.    Objective: BP 130/80   Pulse 78   Temp 98.1 F (36.7 C)   Wt 168 lb 12.8 oz (76.6 kg)   SpO2 97%   BMI 25.67 kg/m   General appearance: alert, no distress, WD/WN, female Abdomen: +bs, soft, mild suprapubic tenderness, non distended, no masses, no hepatomegaly, no splenomegaly, no bruits Back: no CVA tenderness GU: deferred   Assessment: Encounter Diagnoses  Name Primary?  . Urine frequency Yes  . Dysuria      Plan: Discussed symptoms, possible diagnosis of cystis, possible complications, and usual course of  illness.  Begin medication Cipro per orders below.  Advised increased water intake, can use OTC Tylenol for pain.   Urine culture sent.  Call or return if worse or not improving.    Gavin PoundDeborah was seen today for pressure to urine, burning.  Diagnoses and all orders for this visit:  Urine frequency -     Urinalysis Dipstick -     Urine culture  Dysuria -     Urine culture  Other orders -     ciprofloxacin (CIPRO) 500 MG tablet; Take 1 tablet (500 mg total) by mouth 2 (two) times daily.

## 2016-03-10 LAB — URINE CULTURE: Organism ID, Bacteria: NO GROWTH

## 2016-07-06 NOTE — Progress Notes (Signed)
Chief Complaint  Patient presents with  . Hyperlipidemia    fasting med check.    Patient presents for med check.  Hyperlipidemia follow-up:  Patient is reportedly following a low-fat, low cholesterol diet.  Compliant with medications and denies medication side effects Lab Results  Component Value Date   CHOL 167 07/03/2015   HDL 64 07/03/2015   LDLCALC 82 07/03/2015   TRIG 107 07/03/2015   CHOLHDL 2.6 07/03/2015   She sees GYN yearly, Dr. Harrington Challenger retired and she saw someone else last year, but since she wouldn't do her prescriptions, and didn't do a pap smear last year, prefers to start coming here (not scheduled for physical today though).  Last pap smear was 2016 with Dr. Harrington Challenger. Sees Dr. Delman Cheadle yearly for routine skin checks.  She no longer sees specialist for GERD, since doing well on OTC Nexium. Denies dysphagia.   She needs to take Delsym once daily in the morning, along with some cough drops prn, as this was recommended by specialist in the past, and continues to be effective for her.  She donates blood regularly, last 06/12/16, recalls that her Hg was 14.  She takes iron daily due to her regular donation.  Immunization History  Administered Date(s) Administered  . Influenza-Unspecified 10/05/2015  . Tdap 10/09/2011  . Zoster 10/09/2011   She gets flu shots yearly at work (the city gives for free). mammo 09/2015 Colonoscopy 09/2013, normal (Dr. Earlean Shawl; f/u 10 yrs) Normal vitamin D-OH (47) in 06/2015  Past Medical History:  Diagnosis Date  . Allergic rhinitis, cause unspecified    seasonal  . GERD (gastroesophageal reflux disease)   . Pure hypercholesterolemia     No past surgical history on file.  Social History   Social History  . Marital status: Married    Spouse name: N/A  . Number of children: N/A  . Years of education: N/A   Occupational History  . 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 Smoking status: Never Smoker  .  Smokeless tobacco: Never Used  . Alcohol use Yes     Comment: 1 drink a week  . Drug use: No  . Sexual activity: Yes    Birth control/ protection: Post-menopausal   Other Topics Concern  . Not on file   Social History Narrative   Lives with husband.  Mother moved in with her 10/2015. 2 sons locally ([place] and [place]), 1 grandchild.  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  Husband is a smoker, but he doesn't smoke in car or in house.      Youngest son is engaged (updated 2018)    Family History  Problem Relation Age of Onset  . Hyperlipidemia Mother   . Hypothyroidism Mother   . Parkinsonism Mother   . Colon polyps Mother   . Heart disease Mother 10       CABG x 2  . Dementia Mother        mild  . Heart disease Father   . Depression Father   . Hypothyroidism Sister   . Allergies Brother   . Colon polyps Brother   . Colon polyps Sister   . Hypothyroidism Sister   . Cancer Maternal Grandmother        in her back (bone?)  . Heart disease Maternal Grandfather   . Heart disease Paternal Grandmother   . Diabetes Paternal Grandmother   . Heart  disease Paternal Grandfather     Outpatient Encounter Prescriptions as of 07/08/2016  Medication Sig Note  . atorvastatin (LIPITOR) 10 MG tablet Take 1 tablet (10 mg total) by mouth daily.   . Calcium Carbonate-Vitamin D (CALCIUM 500 + D PO) Take by mouth.   . cetirizine (ZYRTEC) 10 MG tablet Take 10 mg by mouth daily. 07/03/2015: Takes 1/2 tablet daily  . Esomeprazole Magnesium (NEXIUM 24HR PO) Take 1 capsule by mouth daily. 07/03/2015: Takes OTC Nexium daily  . IRON PO Take 500 mg by mouth. 07/03/2015: She donates blood every 8 weeks  . vitamin C (ASCORBIC ACID) 500 MG tablet Take 500 mg by mouth daily.   . [DISCONTINUED] atorvastatin (LIPITOR) 10 MG tablet Take 1 tablet (10 mg total) by mouth daily.   . [DISCONTINUED] ciprofloxacin (CIPRO) 500 MG tablet Take 1  tablet (500 mg total) by mouth 2 (two) times daily.    No facility-administered encounter medications on file as of 07/08/2016.     No Known Allergies   ROS:  The patient denies anorexia, fever, weight changes, headaches,  vision changes, decreased hearing, ear pain, sore throat, chest pain, palpitations, dizziness, syncope, dyspnea on exertion, cough, swelling, nausea, vomiting, diarrhea, constipation, abdominal pain, melena, hematochezia, indigestion/heartburn, urinary complaints, joint pains, numbness, tingling, weakness, tremor, suspicious skin lesions, depression, anxiety, abnormal bleeding/bruising, or enlarged lymph nodes.   PHYSICAL EXAM:  BP 128/76 (BP Location: Left Arm, Patient Position: Sitting, Cuff Size: Normal)   Pulse 68   Ht '5\' 8"'$  (1.727 m)   Wt 167 lb 3.2 oz (75.8 kg)   BMI 25.42 kg/m   Wt Readings from Last 3 Encounters:  07/08/16 167 lb 3.2 oz (75.8 kg)  03/08/16 168 lb 12.8 oz (76.6 kg)  02/05/16 169 lb (76.7 kg)    HEENT: PERRL, EOMI, conjunctiva and sclera are clear, OP clear Neck: No lymphadenopathy or thyromegaly, no carotid bruit Heart:  Regular rate and rhythm, no murmurs, rubs, gallops or ectopy Lungs:  Clear bilaterally, without wheezes, rales or ronchi Abdomen:  Soft, nontender, nondistended, no hepatosplenomegaly or masses, normal bowel sounds Extremities:  No clubbing, cyanosis or edema, 2+ pulses.  Neuro:  Alert and oriented x 3, cranial nerves grossly intact. Normal gait Back:  No spine or CVA tenderness Skin: no rashes or suspicious lesions Psych:  Normal mood, affect, hygiene and grooming, normal speech, eye contact   ASSESSMENT/PLAN:  Pure hypercholesterolemia - Plan: Lipid panel, Comprehensive metabolic panel, atorvastatin (LIPITOR) 10 MG tablet  Gastroesophageal reflux disease, esophagitis presence not specified  Medication monitoring encounter - Plan: Lipid panel, Comprehensive metabolic panel, CBC with  Differential/Platelet  Family history of thyroid disease - Plan: TSH   c-met, lipid, CBC, TSH Shingrix recommended.  To check insurance and schedule NV for first injection.  Will need to get second from pharmacy, as she will be on Medicare.  F/u 6 months for Welcome to Medicare physical

## 2016-07-08 ENCOUNTER — Encounter: Payer: Self-pay | Admitting: Family Medicine

## 2016-07-08 ENCOUNTER — Ambulatory Visit (INDEPENDENT_AMBULATORY_CARE_PROVIDER_SITE_OTHER): Payer: 59 | Admitting: Family Medicine

## 2016-07-08 VITALS — BP 128/76 | HR 68 | Ht 68.0 in | Wt 167.2 lb

## 2016-07-08 DIAGNOSIS — Z8349 Family history of other endocrine, nutritional and metabolic diseases: Secondary | ICD-10-CM | POA: Diagnosis not present

## 2016-07-08 DIAGNOSIS — K219 Gastro-esophageal reflux disease without esophagitis: Secondary | ICD-10-CM | POA: Diagnosis not present

## 2016-07-08 DIAGNOSIS — E7800 Pure hypercholesterolemia, unspecified: Secondary | ICD-10-CM

## 2016-07-08 DIAGNOSIS — Z5181 Encounter for therapeutic drug level monitoring: Secondary | ICD-10-CM | POA: Diagnosis not present

## 2016-07-08 LAB — LIPID PANEL
CHOLESTEROL: 164 mg/dL (ref <200)
HDL: 54 mg/dL (ref >50)
LDL Cholesterol: 82 mg/dL (ref <100)
TRIGLYCERIDES: 140 mg/dL (ref <150)
Total CHOL/HDL Ratio: 3 Ratio (ref <5.0)
VLDL: 28 mg/dL (ref <30)

## 2016-07-08 LAB — COMPREHENSIVE METABOLIC PANEL
ALK PHOS: 83 U/L (ref 33–130)
ALT: 12 U/L (ref 6–29)
AST: 16 U/L (ref 10–35)
Albumin: 4.3 g/dL (ref 3.6–5.1)
BUN: 12 mg/dL (ref 7–25)
CALCIUM: 9.5 mg/dL (ref 8.6–10.4)
CO2: 26 mmol/L (ref 20–31)
Chloride: 105 mmol/L (ref 98–110)
Creat: 0.88 mg/dL (ref 0.50–0.99)
Glucose, Bld: 91 mg/dL (ref 65–99)
POTASSIUM: 4 mmol/L (ref 3.5–5.3)
Sodium: 140 mmol/L (ref 135–146)
Total Bilirubin: 1 mg/dL (ref 0.2–1.2)
Total Protein: 6.9 g/dL (ref 6.1–8.1)

## 2016-07-08 LAB — CBC WITH DIFFERENTIAL/PLATELET
Basophils Absolute: 60 cells/uL (ref 0–200)
Basophils Relative: 1 %
Eosinophils Absolute: 120 cells/uL (ref 15–500)
Eosinophils Relative: 2 %
HCT: 42 % (ref 35.0–45.0)
Hemoglobin: 13.6 g/dL (ref 11.7–15.5)
LYMPHS PCT: 21 %
Lymphs Abs: 1260 cells/uL (ref 850–3900)
MCH: 29.2 pg (ref 27.0–33.0)
MCHC: 32.4 g/dL (ref 32.0–36.0)
MCV: 90.1 fL (ref 80.0–100.0)
MPV: 11.4 fL (ref 7.5–12.5)
Monocytes Absolute: 300 cells/uL (ref 200–950)
Monocytes Relative: 5 %
NEUTROS PCT: 71 %
Neutro Abs: 4260 cells/uL (ref 1500–7800)
Platelets: 188 10*3/uL (ref 140–400)
RBC: 4.66 MIL/uL (ref 3.80–5.10)
RDW: 13.6 % (ref 11.0–15.0)
WBC: 6 10*3/uL (ref 4.0–10.5)

## 2016-07-08 LAB — TSH: TSH: 2.01 mIU/L

## 2016-07-08 MED ORDER — ATORVASTATIN CALCIUM 10 MG PO TABS: 10.0000 mg | ORAL_TABLET | Freq: Every day | ORAL | 11 refills | Status: DC

## 2016-07-08 NOTE — Patient Instructions (Signed)
Continue your current medications.  I recommend getting the new shingles vaccine (Shingrix). You will need to check with your insurance to see if it is covered, and if covered by Medicare Part D, you need to get from the pharmacy rather than our office.  It is a series of 2 injections, spaced 2 months apart.  You likely should check your insurance now and return for a nurse visit for the first injection prior to being on Medicare.

## 2016-07-09 ENCOUNTER — Other Ambulatory Visit (INDEPENDENT_AMBULATORY_CARE_PROVIDER_SITE_OTHER): Payer: 59

## 2016-07-09 ENCOUNTER — Encounter: Payer: Self-pay | Admitting: Family Medicine

## 2016-07-09 DIAGNOSIS — Z23 Encounter for immunization: Secondary | ICD-10-CM | POA: Diagnosis not present

## 2016-09-04 ENCOUNTER — Ambulatory Visit: Payer: 59 | Admitting: Family Medicine

## 2016-09-06 ENCOUNTER — Other Ambulatory Visit: Payer: Self-pay | Admitting: Family Medicine

## 2016-09-06 DIAGNOSIS — Z1231 Encounter for screening mammogram for malignant neoplasm of breast: Secondary | ICD-10-CM

## 2016-10-02 ENCOUNTER — Encounter: Payer: Self-pay | Admitting: Family Medicine

## 2016-10-14 ENCOUNTER — Encounter: Payer: Self-pay | Admitting: Family Medicine

## 2016-10-14 ENCOUNTER — Ambulatory Visit
Admission: RE | Admit: 2016-10-14 | Discharge: 2016-10-14 | Disposition: A | Discharge status: Home / Self Care | Payer: Commercial Managed Care - HMO | Source: Ambulatory Visit | Attending: Family Medicine | Admitting: Family Medicine

## 2016-10-14 DIAGNOSIS — Z1231 Encounter for screening mammogram for malignant neoplasm of breast: Secondary | ICD-10-CM
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
## 2017-02-17 ENCOUNTER — Ambulatory Visit (INDEPENDENT_AMBULATORY_CARE_PROVIDER_SITE_OTHER): Payer: Medicare Other | Admitting: Family Medicine

## 2017-02-17 ENCOUNTER — Encounter: Payer: Self-pay | Admitting: Family Medicine

## 2017-02-17 ENCOUNTER — Other Ambulatory Visit (HOSPITAL_COMMUNITY)
Admission: RE | Admit: 2017-02-17 | Discharge: 2017-02-17 | Disposition: A | Discharge status: Home / Self Care | Payer: Medicare Other | Source: Ambulatory Visit | Attending: Family Medicine | Admitting: Family Medicine

## 2017-02-17 VITALS — BP 120/82 | HR 72 | Ht 63.75 in | Wt 174.6 lb

## 2017-02-17 DIAGNOSIS — K219 Gastro-esophageal reflux disease without esophagitis: Secondary | ICD-10-CM | POA: Diagnosis not present

## 2017-02-17 DIAGNOSIS — J309 Allergic rhinitis, unspecified: Secondary | ICD-10-CM

## 2017-02-17 DIAGNOSIS — Z5181 Encounter for therapeutic drug level monitoring: Secondary | ICD-10-CM | POA: Diagnosis not present

## 2017-02-17 DIAGNOSIS — E7800 Pure hypercholesterolemia, unspecified: Secondary | ICD-10-CM

## 2017-02-17 DIAGNOSIS — Z0000 Encounter for general adult medical examination without abnormal findings: Secondary | ICD-10-CM | POA: Insufficient documentation

## 2017-02-17 DIAGNOSIS — Z23 Encounter for immunization: Secondary | ICD-10-CM

## 2017-02-17 DIAGNOSIS — Z780 Asymptomatic menopausal state: Secondary | ICD-10-CM

## 2017-02-17 LAB — COMPREHENSIVE METABOLIC PANEL
ALBUMIN: 4.6 g/dL (ref 3.6–4.8)
ALT: 17 IU/L (ref 0–32)
AST: 19 IU/L (ref 0–40)
Albumin/Globulin Ratio: 1.8 (ref 1.2–2.2)
Alkaline Phosphatase: 91 IU/L (ref 39–117)
BUN/Creatinine Ratio: 14 (ref 12–28)
BUN: 12 mg/dL (ref 8–27)
Bilirubin Total: 0.6 mg/dL (ref 0.0–1.2)
CO2: 24 mmol/L (ref 20–29)
CREATININE: 0.84 mg/dL (ref 0.57–1.00)
Calcium: 9.7 mg/dL (ref 8.7–10.3)
Chloride: 103 mmol/L (ref 96–106)
GFR calc Af Amer: 84 mL/min/{1.73_m2} (ref >59)
GFR calc non Af Amer: 73 mL/min/{1.73_m2} (ref >59)
Globulin, Total: 2.5 g/dL (ref 1.5–4.5)
Glucose: 89 mg/dL (ref 65–99)
Potassium: 4.2 mmol/L (ref 3.5–5.2)
Sodium: 142 mmol/L (ref 134–144)
Total Protein: 7.1 g/dL (ref 6.0–8.5)

## 2017-02-17 LAB — POCT URINALYSIS DIP (PROADVANTAGE DEVICE)
Bilirubin, UA: NEGATIVE
Glucose, UA: NEGATIVE mg/dL
Ketones, POC UA: NEGATIVE mg/dL
Leukocytes, UA: NEGATIVE
Nitrite, UA: NEGATIVE
PROTEIN UA: NEGATIVE mg/dL
Specific Gravity, Urine: 1.015
Urobilinogen, Ur: NEGATIVE
pH, UA: 7 (ref 5.0–8.0)

## 2017-02-17 NOTE — Patient Instructions (Addendum)
HEALTH MAINTENANCE RECOMMENDATIONS:  It is recommended that you get at least 30 minutes of aerobic exercise at least 5 days/week (for weight loss, you may need as much as 60-90 minutes). This can be any activity that gets your heart rate up. This can be divided in 10-15 minute intervals if needed, but try and build up your endurance at least once a week.  Weight bearing exercise is also recommended twice weekly.  Eat a healthy diet with lots of vegetables, fruits and fiber.  "Colorful" foods have a lot of vitamins (ie green vegetables, tomatoes, red peppers, etc).  Limit sweet tea, regular sodas and alcoholic beverages, all of which has a lot of calories and sugar.  Up to 1 alcoholic drink daily may be beneficial for women (unless trying to lose weight, watch sugars).  Drink a lot of water.  Calcium recommendations are 1200-1500 mg daily (1500 mg for postmenopausal women or women without ovaries), and vitamin D 1000 IU daily.  This should be obtained from diet and/or supplements (vitamins), and calcium should not be taken all at once, but in divided doses.  Monthly self breast exams and yearly mammograms for women over the age of 19 is recommended.  Sunscreen of at least SPF 30 should be used on all sun-exposed parts of the skin when outside between the hours of 10 am and 4 pm (not just when at beach or pool, but even with exercise, golf, tennis, and yard work!)  Use a sunscreen that says "broad spectrum" so it covers both UVA and UVB rays, and make sure to reapply every 1-2 hours.  Remember to change the batteries in your smoke detectors when changing your clock times in the spring and fall. Carbon monoxide detectors are recommended.  Use your seat belt every time you are in a car, and please drive safely and not be distracted with cell phones and texting while driving.   Kelli Zavala , Thank you for taking time to come for your Welcome to Medicare Visit. I appreciate your ongoing commitment to  your health goals. Please review the following plan we discussed and let me know if I can assist you in the future.   These are the goals we discussed: Goals    None      This is a list of the screening recommended for you and due dates:  Health Maintenance  Topic Date Due  . DEXA scan (bone density measurement)  09/03/2016  . Pneumonia vaccines (1 of 2 - PCV13) 09/03/2016  . Pap Smear  03/09/2017  . Mammogram  10/15/2018  . Tetanus Vaccine  10/08/2021  . Colon Cancer Screening  10/05/2023  . Flu Shot  Completed  .  Hepatitis C: One time screening is recommended by Center for Disease Control  (CDC) for  adults born from [birth year] through 1964.   Completed  . HIV Screening  Completed    [facility] to schedule your bone density test at your convenience. Yearly mammograms are recommended (due 10/2017, not 2020 as stated above), if your insurance covers it. If you have NO breast concerns, and insurance will NOT cover it yearly, then okay to wait until 10/2018.  Continue yearly flu shots (now high dose flu shots) You were given Prevnar-13 today, and will get pneumovax next year. Pap smear is being done today.  If normal, and no HPV seen, may be able to go 5 years before the next one. Pelvic exams are still recommended yearly.  Try NOT  taking Delsym and see if you still have a cough (if so, you may use it as needed). If you are no longer taking Delsym, and you have no problems with cough, you can consider skipping a day of Nexium, and if not recurrent cough/reflux, consider cutting back further (skipping every 3rd day, vs every other, etc).  If you truly need it daily, it is fine to continue.   Consider changing to claritin or allegra which are less sedating, and taking the full dose, to help with allergies and postnasal drip (which also contributes to cough).

## 2017-02-18 ENCOUNTER — Encounter: Payer: Self-pay | Admitting: Family Medicine

## 2017-02-19 LAB — CYTOLOGY - PAP
Diagnosis: NEGATIVE
HPV: NOT DETECTED

## 2017-03-26 ENCOUNTER — Encounter: Payer: Self-pay | Admitting: Family Medicine

## 2017-04-14 DIAGNOSIS — M8580 Other specified disorders of bone density and structure, unspecified site: Secondary | ICD-10-CM

## 2017-04-14 HISTORY — DX: Other specified disorders of bone density and structure, unspecified site: M85.80

## 2017-04-21 ENCOUNTER — Ambulatory Visit
Admission: RE | Admit: 2017-04-21 | Discharge: 2017-04-21 | Disposition: A | Discharge status: Home / Self Care | Payer: Medicare Other | Source: Ambulatory Visit | Attending: Family Medicine | Admitting: Family Medicine

## 2017-04-21 DIAGNOSIS — Z780 Asymptomatic menopausal state: Secondary | ICD-10-CM

## 2017-04-27 ENCOUNTER — Other Ambulatory Visit: Payer: Self-pay | Admitting: Family Medicine

## 2017-04-27 DIAGNOSIS — E7800 Pure hypercholesterolemia, unspecified: Secondary | ICD-10-CM

## 2017-09-02 ENCOUNTER — Other Ambulatory Visit: Payer: Self-pay | Admitting: Family Medicine

## 2017-09-02 DIAGNOSIS — Z1231 Encounter for screening mammogram for malignant neoplasm of breast: Secondary | ICD-10-CM

## 2017-09-30 ENCOUNTER — Encounter: Payer: Self-pay | Admitting: Family Medicine

## 2017-10-01 ENCOUNTER — Encounter: Payer: Self-pay | Admitting: *Deleted

## 2017-10-17 ENCOUNTER — Ambulatory Visit
Admission: RE | Admit: 2017-10-17 | Discharge: 2017-10-17 | Disposition: A | Discharge status: Home / Self Care | Payer: Medicare Other | Source: Ambulatory Visit | Attending: Family Medicine | Admitting: Family Medicine

## 2017-10-17 DIAGNOSIS — Z1231 Encounter for screening mammogram for malignant neoplasm of breast: Secondary | ICD-10-CM

## 2017-11-11 ENCOUNTER — Encounter: Payer: Self-pay | Admitting: Family Medicine

## 2018-01-23 ENCOUNTER — Encounter: Payer: Self-pay | Admitting: Family Medicine

## 2018-02-17 DIAGNOSIS — M85851 Other specified disorders of bone density and structure, right thigh: Secondary | ICD-10-CM | POA: Insufficient documentation

## 2018-02-17 NOTE — Patient Instructions (Addendum)
HEALTH MAINTENANCE RECOMMENDATIONS:  It is recommended that you get at least 30 minutes of aerobic exercise at least 5 days/week (for weight loss, you may need as much as 60-90 minutes). This can be any activity that gets your heart rate up. This can be divided in 10-15 minute intervals if needed, but try and build up your endurance at least once a week.  Weight bearing exercise is also recommended twice weekly.  Eat a healthy diet with lots of vegetables, fruits and fiber.  "Colorful" foods have a lot of vitamins (ie green vegetables, tomatoes, red peppers, etc).  Limit sweet tea, regular sodas and alcoholic beverages, all of which has a lot of calories and sugar.  Up to 1 alcoholic drink daily may be beneficial for women (unless trying to lose weight, watch sugars).  Drink a lot of water.  Calcium recommendations are 1200-1500 mg daily (1500 mg for postmenopausal women or women without ovaries), and vitamin D 1000 IU daily.  This should be obtained from diet and/or supplements (vitamins), and calcium should not be taken all at once, but in divided doses.  Monthly self breast exams and yearly mammograms for women over the age of 66 is recommended.  Sunscreen of at least SPF 30 should be used on all sun-exposed parts of the skin when outside between the hours of 10 am and 4 pm (not just when at beach or pool, but even with exercise, golf, tennis, and yard work!)  Use a sunscreen that says "broad spectrum" so it covers both UVA and UVB rays, and make sure to reapply every 1-2 hours.  Remember to change the batteries in your smoke detectors when changing your clock times in the spring and fall. Carbon monoxide detectors are recommended for your home.  Use your seat belt every time you are in a car, and please drive safely and not be distracted with cell phones and texting while driving.    Kelli Zavala , Thank you for taking time to come for your Medicare Wellness Visit. I appreciate your ongoing  commitment to your health goals. Please review the following plan we discussed and let me know if I can assist you in the future.   This is a list of the screening recommended for you and due dates:  Health Maintenance  Topic Date Due  . Pneumonia vaccines (2 of 2 - PPSV23) 02/17/2018  . Mammogram  10/18/2019  . Tetanus Vaccine  10/08/2021  . Colon Cancer Screening  10/05/2023  . Flu Shot  Completed  . DEXA scan (bone density measurement)  Completed  .  Hepatitis C: One time screening is recommended by Center for Disease Control  (CDC) for  adults born from [birth year] through 1964.   Completed   You got the second pneumonia vaccine today (pneumovax), to complete your pneumonia vaccines Continue yearly mammograms (next due 10/2018, NOT 2021 as stated above). Continue yearly high dose flu shots  We should consider another bone density test in another 2-3 years.  Be sure to get calcium, D and weight-bearing exercise as we discussed to keep your bone density from further declining.  There was only mild thinning noted on your last bone density test last Spring.  Consider using Flonase daily to help with nasal congestion and snoring (sometimes also helps some with eye allergies).  If you'd like a prescription sent, let us know.  It is important for you to try and lose some of the weight you gained. We discussed cutting back on  carbs, eliminating calories from things like soda, juice, sweet tea and other items that don't have a lot of nutritional value.     MyPlate from USDA  MyPlate is an outline of a general healthy diet based on the 2010 Dietary Guidelines for Americans, from the U.S. Department of Agriculture Architect). It sets guidelines for how much food you should eat from each food group based on your age, sex, and level of physical activity. What are tips for following MyPlate? To follow MyPlate recommendations:  Eat a wide variety of fruits and vegetables, grains, and protein foods.  Serve  smaller portions and eat less food throughout the day.  Limit portion sizes to avoid overeating.  Enjoy your food.  Get at least 150 minutes of exercise every week. This is about 30 minutes each day, 5 or more days per week. It can be difficult to have every meal look like MyPlate. Think about MyPlate as eating guidelines for an entire day, rather than each individual meal. Fruits and vegetables  Make half of your plate fruits and vegetables.  Eat many different colors of fruits and vegetables each day.  For a 2,000 calorie daily food plan, eat: ? 2 cups of vegetables every day. ? 2 cups of fruit every day.  1 cup is equal to: ? 1 cup raw or cooked vegetables. ? 1 cup raw fruit. ? 1 medium-sized orange, apple, or banana. ? 1 cup 100% fruit or vegetable juice. ? 2 cups raw leafy greens, such as lettuce, spinach, or kale. ?  cup dried fruit. Grains  One fourth of your plate should be grains.  Make at least half of the grains you eat each day whole grains.  For a 2,000 calorie daily food plan, eat 6 oz of grains every day.  1 oz is equal to: ? 1 slice bread. ? 1 cup cereal. ?  cup cooked rice, cereal, or pasta. Protein  One fourth of your plate should be protein.  Eat a wide variety of protein foods, including meat, poultry, fish, eggs, beans, nuts, and tofu.  For a 2,000 calorie daily food plan, eat 5 oz of protein every day.  1 oz is equal to: ? 1 oz meat, poultry, or fish. ?  cup cooked beans. ? 1 egg. ?  oz nuts or seeds. ? 1 Tbsp peanut butter. Dairy  Drink fat-free or low-fat (1%) milk.  Eat or drink dairy as a side to meals.  For a 2,000 calorie daily food plan, eat or drink 3 cups of dairy every day.  1 cup is equal to: ? 1 cup milk, yogurt, cottage cheese, or soy milk (soy beverage). ? 2 oz processed cheese. ? 1 oz natural cheese. Fats, oils, salt, and sugars  Only small amounts of oils are recommended.  Avoid foods that are high in  calories and low in nutritional value (empty calories), like foods high in fat or added sugars.  Choose foods that are low in salt (sodium). Choose foods that have less than 140 milligrams (mg) of sodium per serving.  Drink water instead of sugary drinks. Drink enough water each day to keep your urine pale yellow. Where to find support  Work with your health care provider or a nutrition specialist (dietitian) to develop a customized eating plan that is right for you.  Download an app (mobile application) to help you track your daily food intake. Where to find more information  Go to https://www.[facility].org/ for more information.  Learn more and  log your daily food intake according to MyPlate using USDA's SuperTracker: www.supertracker.usda.gov Summary  MyPlate is a general guideline for healthy eating from the USDA. It is based on the 2010 Dietary Guidelines for Americans.  In general, fruits and vegetables should take up  of your plate, grains should take up  of your plate, and protein should take up  of your plate. This information is not intended to replace advice given to you by your health care provider. Make sure you discuss any questions you have with your health care provider. Document Released: 01/20/2007 Document Revised: 04/01/2016 Document Reviewed: 04/01/2016 Elsevier Interactive Patient Education  2019 [employer]
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
## 2018-02-18 ENCOUNTER — Ambulatory Visit: Payer: Medicare Other | Admitting: Family Medicine

## 2018-02-18 ENCOUNTER — Encounter: Payer: Self-pay | Admitting: Family Medicine

## 2018-02-18 VITALS — BP 120/82 | HR 80 | Ht 63.5 in | Wt 183.0 lb

## 2018-02-18 DIAGNOSIS — Z5181 Encounter for therapeutic drug level monitoring: Secondary | ICD-10-CM

## 2018-02-18 DIAGNOSIS — Z23 Encounter for immunization: Secondary | ICD-10-CM | POA: Diagnosis not present

## 2018-02-18 DIAGNOSIS — Z0000 Encounter for general adult medical examination without abnormal findings: Secondary | ICD-10-CM | POA: Diagnosis not present

## 2018-02-18 DIAGNOSIS — G478 Other sleep disorders: Secondary | ICD-10-CM

## 2018-02-18 DIAGNOSIS — R635 Abnormal weight gain: Secondary | ICD-10-CM

## 2018-02-18 DIAGNOSIS — J309 Allergic rhinitis, unspecified: Secondary | ICD-10-CM

## 2018-02-18 DIAGNOSIS — E7800 Pure hypercholesterolemia, unspecified: Secondary | ICD-10-CM

## 2018-02-18 DIAGNOSIS — M85851 Other specified disorders of bone density and structure, right thigh: Secondary | ICD-10-CM

## 2018-02-18 DIAGNOSIS — Z6831 Body mass index (BMI) 31.0-31.9, adult: Secondary | ICD-10-CM

## 2018-02-18 DIAGNOSIS — R0683 Snoring: Secondary | ICD-10-CM

## 2018-02-18 DIAGNOSIS — K219 Gastro-esophageal reflux disease without esophagitis: Secondary | ICD-10-CM | POA: Diagnosis not present

## 2018-02-18 DIAGNOSIS — E6609 Other obesity due to excess calories: Secondary | ICD-10-CM

## 2018-02-18 LAB — POCT URINALYSIS DIP (PROADVANTAGE DEVICE)
Bilirubin, UA: NEGATIVE
Glucose, UA: NEGATIVE mg/dL
Ketones, POC UA: NEGATIVE mg/dL
Leukocytes, UA: NEGATIVE
Nitrite, UA: NEGATIVE
PROTEIN UA: NEGATIVE mg/dL
Specific Gravity, Urine: 1.025
UUROB: NEGATIVE
pH, UA: 6 (ref 5.0–8.0)

## 2018-02-18 MED ORDER — ATORVASTATIN CALCIUM 10 MG PO TABS: ORAL_TABLET | ORAL | 3 refills | Status: DC

## 2018-02-19 LAB — CBC WITH DIFFERENTIAL/PLATELET
BASOS ABS: 0 10*3/uL (ref 0.0–0.2)
Basos: 1 %
EOS (ABSOLUTE): 0.1 10*3/uL (ref 0.0–0.4)
Eos: 2 %
HEMOGLOBIN: 14.9 g/dL (ref 11.1–15.9)
Hematocrit: 43 % (ref 34.0–46.6)
Immature Grans (Abs): 0 10*3/uL (ref 0.0–0.1)
Immature Granulocytes: 0 %
Lymphocytes Absolute: 1.6 10*3/uL (ref 0.7–3.1)
Lymphs: 32 %
MCH: 31 pg (ref 26.6–33.0)
MCHC: 34.7 g/dL (ref 31.5–35.7)
MCV: 89 fL (ref 79–97)
Monocytes Absolute: 0.3 10*3/uL (ref 0.1–0.9)
Monocytes: 7 %
Neutrophils Absolute: 2.9 10*3/uL (ref 1.4–7.0)
Neutrophils: 58 %
Platelets: 178 10*3/uL (ref 150–450)
RBC: 4.81 x10E6/uL (ref 3.77–5.28)
RDW: 12.5 % (ref 11.7–15.4)
WBC: 5 10*3/uL (ref 3.4–10.8)

## 2018-02-19 LAB — COMPREHENSIVE METABOLIC PANEL
ALT: 27 IU/L (ref 0–32)
AST: 24 IU/L (ref 0–40)
Albumin/Globulin Ratio: 2.1 (ref 1.2–2.2)
Albumin: 4.6 g/dL (ref 3.8–4.8)
Alkaline Phosphatase: 93 IU/L (ref 39–117)
BUN/Creatinine Ratio: 20 (ref 12–28)
BUN: 18 mg/dL (ref 8–27)
Bilirubin Total: 1.3 mg/dL — ABNORMAL HIGH (ref 0.0–1.2)
CO2: 25 mmol/L (ref 20–29)
Calcium: 9.9 mg/dL (ref 8.7–10.3)
Chloride: 102 mmol/L (ref 96–106)
Creatinine, Ser: 0.91 mg/dL (ref 0.57–1.00)
GFR calc Af Amer: 76 mL/min/{1.73_m2} (ref >59)
GFR calc non Af Amer: 66 mL/min/{1.73_m2} (ref >59)
Globulin, Total: 2.2 g/dL (ref 1.5–4.5)
Glucose: 81 mg/dL (ref 65–99)
Potassium: 4.3 mmol/L (ref 3.5–5.2)
Sodium: 142 mmol/L (ref 134–144)
Total Protein: 6.8 g/dL (ref 6.0–8.5)

## 2018-02-19 LAB — LIPID PANEL
CHOLESTEROL TOTAL: 195 mg/dL (ref 100–199)
Chol/HDL Ratio: 3.1 ratio (ref 0.0–4.4)
HDL: 63 mg/dL (ref >39)
LDL Calculated: 108 mg/dL — ABNORMAL HIGH (ref 0–99)
Triglycerides: 122 mg/dL (ref 0–149)
VLDL Cholesterol Cal: 24 mg/dL (ref 5–40)

## 2018-02-19 LAB — TSH: TSH: 2.8 u[IU]/mL (ref 0.450–4.500)

## 2018-03-11 ENCOUNTER — Ambulatory Visit (HOSPITAL_BASED_OUTPATIENT_CLINIC_OR_DEPARTMENT_OTHER): Payer: Medicare Other | Attending: Family Medicine | Admitting: Internal Medicine

## 2018-03-11 DIAGNOSIS — R0683 Snoring: Secondary | ICD-10-CM

## 2018-03-11 DIAGNOSIS — G4733 Obstructive sleep apnea (adult) (pediatric): Secondary | ICD-10-CM | POA: Insufficient documentation

## 2018-03-11 DIAGNOSIS — Z6831 Body mass index (BMI) 31.0-31.9, adult: Secondary | ICD-10-CM

## 2018-03-11 DIAGNOSIS — G478 Other sleep disorders: Secondary | ICD-10-CM

## 2018-03-21 DIAGNOSIS — G4733 Obstructive sleep apnea (adult) (pediatric): Secondary | ICD-10-CM

## 2018-03-21 DIAGNOSIS — G478 Other sleep disorders: Secondary | ICD-10-CM

## 2018-03-21 DIAGNOSIS — R0683 Snoring: Secondary | ICD-10-CM | POA: Diagnosis not present

## 2018-03-21 NOTE — Procedures (Signed)
    Patient Name: Kelli Zavala, Kelli Zavala Date: 03/11/2018 Gender: Female D.O.B: Sep 14, 1950 Age (years): 67 Referring Provider: Joselyn Arrow Height (inches): 63 Interpreting Physician: Jetty Duhamel MD, ABSM Weight (lbs): 183 RPSGT: Elaina Pattee BMI: 32 MRN: [id number] Neck Size: 15.00  CLINICAL INFORMATION Sleep Study Type: HST Indication for sleep study: Snoring (786.09)  Epworth Sleepiness Score: 5  SLEEP STUDY TECHNIQUE A multi-channel overnight portable sleep study was performed. The channels recorded were: nasal airflow, thoracic respiratory movement, and oxygen saturation with a pulse oximetry. Snoring was also monitored.  MEDICATIONS Patient self administered medications include: none reported.  SLEEP ARCHITECTURE Patient was studied for 446.3 minutes. The sleep efficiency was 99.9 % and the patient was supine for 99.7%. The arousal index was 0.0 per hour.  RESPIRATORY PARAMETERS The overall AHI was 77.0 per hour, with a central apnea index of 0.0 per hour. The oxygen nadir was 83% during sleep.  CARDIAC DATA Mean heart rate during sleep was 63.5 bpm.  IMPRESSIONS - Severe obstructive sleep apnea occurred during this study (AHI = 77.0/h). - No significant central sleep apnea occurred during this study (CAI = 0.0/h). - Moderate oxygen desaturation was noted during this study (Min O2 = 83%). - Patient snored.  DIAGNOSIS - Obstructive Sleep Apnea (327.23 [G47.33 ICD-10])  RECOMMENDATIONS - Suggest CPAP titration sleep study or autopap. Other options would be based on clinical judgment. - Be careful with alcohol, sedatives and other CNS depressants that may worsen sleep apnea and disrupt normal sleep architecture. - Sleep hygiene should be reviewed to assess factors that may improve sleep quality. - Weight management and regular exercise should be initiated or continued.  [Electronically signed] 03/21/2018 11:44 AM  Jetty Duhamel MD, ABSM Diplomate, American  Board of Sleep Medicine   NPI: [id number]                         Jetty Duhamel Diplomate, American Board of Sleep Medicine  ELECTRONICALLY SIGNED ON:  03/21/2018, 11:43 AM  [facility] PH: [phone]   FX: [phone] ACCREDITED BY THE AMERICAN ACADEMY OF SLEEP MEDICINE

## 2018-03-22 ENCOUNTER — Encounter: Payer: Self-pay | Admitting: Family Medicine

## 2018-03-22 DIAGNOSIS — G4733 Obstructive sleep apnea (adult) (pediatric): Secondary | ICD-10-CM | POA: Insufficient documentation

## 2018-03-23 ENCOUNTER — Encounter: Payer: Self-pay | Admitting: Medical

## 2018-03-23 ENCOUNTER — Ambulatory Visit: Payer: Medicare Other | Admitting: Medical

## 2018-03-23 VITALS — BP 124/76 | HR 90 | Temp 98.8°F | Resp 16 | Ht 63.0 in | Wt 180.8 lb

## 2018-03-23 DIAGNOSIS — J0110 Acute frontal sinusitis, unspecified: Secondary | ICD-10-CM

## 2018-03-23 DIAGNOSIS — R05 Cough: Secondary | ICD-10-CM | POA: Diagnosis not present

## 2018-03-23 DIAGNOSIS — R059 Cough, unspecified: Secondary | ICD-10-CM

## 2018-03-23 MED ORDER — HYDROCOD POLST-CPM POLST ER 10-8 MG/5ML PO SUER: 5.0000 mL | Freq: Two times a day (BID) | ORAL | 0 refills | Status: DC | PRN

## 2018-03-23 MED ORDER — AMOXICILLIN 875 MG PO TABS: 875.0000 mg | ORAL_TABLET | Freq: Two times a day (BID) | ORAL | 0 refills | Status: DC

## 2018-03-23 NOTE — Progress Notes (Signed)
Subjective:  Kelli Zavala is a 68 y.o. female who presents for  Chief Complaint  Patient presents with  . cough    cough, congestion travel to [place] X 3 weeks   2 week hx/o cold symptoms that wont go away.  For 2 weeks has had cough, congestion.   She report Friday night 3 days ago started getting worse again.   Felt awful last night, still feels yucky today.   Has cough, some clear production, some sore throat, No chills or body aches.   No ear pain, no fever, no NVD.  Has used some chloraseptic a few times, but only took some OTC medication for cough early on, just using some cough drops now.  She is a nonsmoker.  No other aggravating or relieving factors.  No other complaint.    Past Medical History:  Diagnosis Date  . Allergic rhinitis, cause unspecified    seasonal  . GERD (gastroesophageal reflux disease)   . Osteopenia 04/2017   mild, R fem neck  . Pure hypercholesterolemia     Current Outpatient Medications on File Prior to Visit  Medication Sig Dispense Refill  . atorvastatin (LIPITOR) 10 MG tablet TAKE 1 TABLET(10 MG) BY MOUTH DAILY 90 tablet 3  . Calcium Carbonate-Vitamin D (CALCIUM 500 + D PO) Take by mouth.    . Esomeprazole Magnesium (NEXIUM 24HR PO) Take 1 capsule by mouth daily.    . fluconazole (DIFLUCAN) 200 MG tablet Take 200 mg by mouth once a week.    . loratadine (CLARITIN) 10 MG tablet Take 10 mg by mouth daily.    . Multiple Vitamins-Minerals (WOMENS MULTIVITAMIN PO) Take 1 tablet by mouth daily.    . vitamin C (ASCORBIC ACID) 500 MG tablet Take 500 mg by mouth daily.     No current facility-administered medications on file prior to visit.     ROS as in subjective   Objective: BP 124/76   Pulse 90   Temp 98.8 F (37.1 C) (Oral)   Resp 16   Ht 5\' 3"  (1.6 m)   Wt 180 lb 12.8 oz (82 kg)   SpO2 97%   BMI 32.03 kg/m   General appearance: Alert, well developed, well nourished, no distress                             Skin: warm, no rash               Head: +frontal sinus tenderness,                            Eyes: conjunctiva pink, corneas clear                            Ears: flat left tympanic membrane, flat right tympanic membrane, external ear canals normal                          Nose: septum midline, turbinates swollen, with erythema and clear discharge             Mouth/throat: MMM, tongue normal, mild pharyngeal erythema                           Neck: supple, no adenopathy, no thyromegaly, non tender  Lungs: clear, no wheezes, no rales, no rhonchi        Assessment  Encounter Diagnoses  Name Primary?  . Cough Yes  . Acute non-recurrent frontal sinusitis       Plan: Discussed diagnosis of sinusitis.   Discussed usual time frame to see improvement. Discussed possible complications or symptoms that would prompt call back or recheck within the next few days.     Medications prescribed:  Complete the course of Amoxicillin antibiotic prescribed today.   Begin Tussionex for cough suppression if OTC cough medication isn't helping.   Caution advised as this may cause drowsiness.  Specific home care recommendations discussed:  Pain/fever relief: You may use over-the-counter Tylenol for pain or fever  Drink extra fluids. Fluids help thin the mucus so your sinuses can drain more easily.   Applying either moist heat or ice packs to the sinus areas may help relieve discomfort.  Use saline nasal sprays to help moisten your sinuses. The sprays can be found at your local drugstore.   Patient was advised to call or return if worse or not improving in the next few days.    Patient voiced understanding of diagnosis, recommendations, and treatment plan.  Kelli Zavala was seen today for cough.  Diagnoses and all orders for this visit:  Cough  Acute non-recurrent frontal sinusitis  Other orders -     chlorpheniramine-HYDROcodone (TUSSIONEX PENNKINETIC ER) 10-8 MG/5ML SUER; Take 5 mLs by mouth  every 12 (twelve) hours as needed for cough. -     amoxicillin (AMOXIL) 875 MG tablet; Take 1 tablet (875 mg total) by mouth 2 (two) times daily.

## 2018-03-31 ENCOUNTER — Encounter: Payer: Self-pay | Admitting: Family Medicine

## 2018-04-10 ENCOUNTER — Encounter: Payer: Self-pay | Admitting: Family Medicine

## 2018-06-22 ENCOUNTER — Telehealth: Payer: Self-pay

## 2018-06-22 NOTE — Telephone Encounter (Signed)
-----   Message from Rita Ohara, MD sent at 06/19/2018  9:21 PM EDT ----- Regarding: visit on 6/17 This is the "face to face" for CPAP f/u.  I believe the paperwork is on Veronica's desk--please look at it.  I believe the paperwork said that the visit could be done virtually.  Verify this on the paperwork, and if my recollection is correct, change this to a virtual visit please.  Thanks

## 2018-06-22 NOTE — Telephone Encounter (Signed)
I did view paperwork which does state virtual. 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 The face to face was her preference.

## 2018-06-22 NOTE — Telephone Encounter (Signed)
That's fine if pt prefers in person visit (will need all screening done closer to appt). You'll need to look at paperwork to see what it stated, but I believe it said phone call okay (didn't have to be video visit).  So that is an option if she would like to use it (if you can confirm by looking at the paperwork)

## 2018-06-22 NOTE — Telephone Encounter (Signed)
Spoke to patient and she prefers to have a face to face visit. She said if for any reason she can't come in then she can be contacted on her cell phone. She does not have a smart phone. Please advise if any changes need to be made.

## 2018-06-30 DIAGNOSIS — G4733 Obstructive sleep apnea (adult) (pediatric): Secondary | ICD-10-CM | POA: Insufficient documentation

## 2018-06-30 NOTE — Progress Notes (Signed)
Chief Complaint  Patient presents with  . Consult    face to face for CPAP.    Patient presents for follow-up on her sleep apnea.  Severe OSA was diagnosed on 03/2018 sleep study. She is using her CPAP machine nightly.  She denies any problems with it (rarely needs to adjust mask if she notices a leak).  It took a few weeks, but now she is used to it and sleeping well. Compliance report from 4/15-5/14 showed 100% usage >4 hours (more than 7 hours average).  Report received today from 5/17-6/15 shows 100% usage (2 days <4 hours, pt had mechanical issues). Median pressure 8.4, median leak 0.7 L/min.  She feels much more rested, sleeping better at night.  No longer takes naps in the afternoon.  No longer waking up in the night and being unable to get back to sleep (used to have that problem).  She overall is feeling very good.  She is still working for the [employer], 3x/week.  She recently starting doing Zoom exercise class at noon.  She walks some with her mother (who lives with her).  She is snacking less in the evenings and has lost some weight.  She remains on fluconazole once a week for onychomycosis from the dermatologist. Her lipitor dose was cut to 1/2 tablet while taking fluconazole.  She has f/u with dermatologist next week (has been taking weekly diflucan since 01/2018.)  PMH, PSH, [place] reviewed  Outpatient Encounter Medications as of 07/01/2018  Medication Sig Note  . atorvastatin (LIPITOR) 10 MG tablet TAKE 1 TABLET(10 MG) BY MOUTH DAILY 07/01/2018: Taking 1/2 tablet while on fluconazole  . Calcium Carbonate-Vitamin D (CALCIUM 500 + D PO) Take by mouth.   . Esomeprazole Magnesium (NEXIUM 24HR PO) Take 1 capsule by mouth daily. 07/03/2015: Takes OTC Nexium daily  . fluconazole (DIFLUCAN) 200 MG tablet Take 200 mg by mouth once a week. 02/18/2018: From dermatologist for onychomycosis, started 01/2018  . loratadine (CLARITIN) 10 MG tablet Take 10 mg by mouth daily.   . Multiple  Vitamins-Minerals (WOMENS MULTIVITAMIN PO) Take 1 tablet by mouth daily.   . vitamin C (ASCORBIC ACID) 500 MG tablet Take 500 mg by mouth daily.   . [DISCONTINUED] amoxicillin (AMOXIL) 875 MG tablet Take 1 tablet (875 mg total) by mouth 2 (two) times daily.   . [DISCONTINUED] chlorpheniramine-HYDROcodone (TUSSIONEX PENNKINETIC ER) 10-8 MG/5ML SUER Take 5 mLs by mouth every 12 (twelve) hours as needed for cough.    No facility-administered encounter medications on file as of 07/01/2018.    No Known Allergies  ROS: no fever, chills, URI symptoms, headaches, shortness of breath, chest pain, edema or other complaints. She is less tired, sleeping better, since using CPAP.   PHYSICAL EXAM:  BP 120/72   Pulse 80   Temp 98 F (36.7 C) (Temporal)   Ht 5\' 3"  (1.6 m)   Wt 177 lb 3.2 oz (80.4 kg)   BMI 31.39 kg/m   Wt Readings from Last 3 Encounters:  07/01/18 177 lb 3.2 oz (80.4 kg)  03/23/18 180 lb 12.8 oz (82 kg)  02/18/18 183 lb (83 kg)   Well developed, pleasant, overweight female in no distress HEENT: conjunctiva and sclera are clear, EOMI.  Nose and mouth not examined, wearing mask due to COVID-19 pandemic. Neck: no lymphadenopathy, thyromegaly or carotid bruit Heart: regular rate and rhythm, no murmur Lungs: clear bilaterally Extremities: no edema, normal pulses.  Toenails are polished (unable to examine)    ASSESSMENT/PLAN:  Obstructive sleep apnea on CPAP - Compliant with use. She continues to benfit from therapy and should continue CPAP use.   Pure hypercholesterolemia - cont 1/2 tab lipitor until a week after fluconazole stopped. If course is being extended, needs LFT's done     Needs LFT's if staying on diflucan.  If completing course, can resume full atorvastatin dose and f/u as planned here.  F/u as scheduled for CPE in 02/2019, sooner prn

## 2018-07-01 ENCOUNTER — Encounter: Payer: Self-pay | Admitting: Family Medicine

## 2018-07-01 ENCOUNTER — Ambulatory Visit (INDEPENDENT_AMBULATORY_CARE_PROVIDER_SITE_OTHER): Payer: Medicare Other | Admitting: Family Medicine

## 2018-07-01 VITALS — BP 120/72 | HR 80 | Temp 98.0°F | Ht 63.0 in | Wt 177.2 lb

## 2018-07-01 DIAGNOSIS — G4733 Obstructive sleep apnea (adult) (pediatric): Secondary | ICD-10-CM

## 2018-07-01 DIAGNOSIS — E7800 Pure hypercholesterolemia, unspecified: Secondary | ICD-10-CM | POA: Diagnosis not present

## 2018-07-01 DIAGNOSIS — Z9989 Dependence on other enabling machines and devices: Secondary | ICD-10-CM

## 2018-07-01 NOTE — Patient Instructions (Signed)
Continue CPAP.  If you are staying on fluconazole, I recommend having liver tests checked.  Let us know and we can do here (if not arranged by your dermatologist). When you stop the fluconazole, you may resume the full tablet of your cholesterol medication (when your next dose of fluconazole would have been due, a week later).

## 2018-07-07 ENCOUNTER — Encounter: Payer: Self-pay | Admitting: Family Medicine

## 2018-09-06 ENCOUNTER — Encounter: Payer: Self-pay | Admitting: Family Medicine

## 2018-09-07 ENCOUNTER — Encounter: Payer: Self-pay | Admitting: *Deleted

## 2018-09-09 ENCOUNTER — Other Ambulatory Visit: Payer: Self-pay | Admitting: Family Medicine

## 2018-09-09 DIAGNOSIS — Z1231 Encounter for screening mammogram for malignant neoplasm of breast: Secondary | ICD-10-CM

## 2018-11-02 ENCOUNTER — Ambulatory Visit
Admission: RE | Admit: 2018-11-02 | Discharge: 2018-11-02 | Disposition: A | Discharge status: Home / Self Care | Payer: Medicare Other | Source: Ambulatory Visit | Attending: Family Medicine | Admitting: Family Medicine

## 2018-11-02 ENCOUNTER — Other Ambulatory Visit: Payer: Self-pay

## 2018-11-02 DIAGNOSIS — Z1231 Encounter for screening mammogram for malignant neoplasm of breast: Secondary | ICD-10-CM

## 2018-11-05 ENCOUNTER — Other Ambulatory Visit: Payer: Self-pay

## 2018-11-05 DIAGNOSIS — Z20822 Contact with and (suspected) exposure to covid-19: Secondary | ICD-10-CM

## 2018-11-06 ENCOUNTER — Encounter: Payer: Self-pay | Admitting: Family Medicine

## 2018-11-07 LAB — NOVEL CORONAVIRUS, NAA: SARS-CoV-2, NAA: NOT DETECTED

## 2019-02-20 NOTE — Patient Instructions (Signed)
  HEALTH MAINTENANCE RECOMMENDATIONS:  It is recommended that you get at least 30 minutes of aerobic exercise at least 5 days/week (for weight loss, you may need as much as 60-90 minutes). This can be any activity that gets your heart rate up. This can be divided in 10-15 minute intervals if needed, but try and build up your endurance at least once a week.  Weight bearing exercise is also recommended twice weekly.  Eat a healthy diet with lots of vegetables, fruits and fiber.  "Colorful" foods have a lot of vitamins (ie green vegetables, tomatoes, red peppers, etc).  Limit sweet tea, regular sodas and alcoholic beverages, all of which has a lot of calories and sugar.  Up to 1 alcoholic drink daily may be beneficial for women (unless trying to lose weight, watch sugars).  Drink a lot of water.  Calcium recommendations are 1200-1500 mg daily (1500 mg for postmenopausal women or women without ovaries), and vitamin D 1000 IU daily.  This should be obtained from diet and/or supplements (vitamins), and calcium should not be taken all at once, but in divided doses.  Monthly self breast exams and yearly mammograms for women over the age of 59 is recommended.  Sunscreen of at least SPF 30 should be used on all sun-exposed parts of the skin when outside between the hours of 10 am and 4 pm (not just when at beach or pool, but even with exercise, golf, tennis, and yard work!)  Use a sunscreen that says "broad spectrum" so it covers both UVA and UVB rays, and make sure to reapply every 1-2 hours.  Remember to change the batteries in your smoke detectors when changing your clock times in the spring and fall. Carbon monoxide detectors are recommended for your home.  Use your seat belt every time you are in a car, and please drive safely and not be distracted with cell phones and texting while driving.   Ms. Halle , Thank you for taking time to come for your Medicare Wellness Visit. I appreciate your ongoing  commitment to your health goals. Please review the following plan we discussed and let me know if I can assist you in the future.    This is a list of the screening recommended for you and due dates:  Health Maintenance  Topic Date Due  . Mammogram  11/02/2019  . Tetanus Vaccine  10/08/2021  . Colon Cancer Screening  10/05/2023  . Flu Shot  Completed  . DEXA scan (bone density measurement)  Completed  .  Hepatitis C: One time screening is recommended by Center for Disease Control  (CDC) for  adults born from [birth year] through 1964.   Completed  . Pneumonia vaccines  Completed
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
## 2019-02-22 ENCOUNTER — Encounter: Payer: Self-pay | Admitting: Family Medicine

## 2019-02-22 ENCOUNTER — Other Ambulatory Visit: Payer: Self-pay

## 2019-02-22 ENCOUNTER — Ambulatory Visit (INDEPENDENT_AMBULATORY_CARE_PROVIDER_SITE_OTHER): Payer: Medicare Other | Admitting: Family Medicine

## 2019-02-22 VITALS — BP 130/80 | HR 84 | Temp 98.6°F | Ht 63.0 in | Wt 177.8 lb

## 2019-02-22 DIAGNOSIS — E7800 Pure hypercholesterolemia, unspecified: Secondary | ICD-10-CM

## 2019-02-22 DIAGNOSIS — G4733 Obstructive sleep apnea (adult) (pediatric): Secondary | ICD-10-CM

## 2019-02-22 DIAGNOSIS — Z0000 Encounter for general adult medical examination without abnormal findings: Secondary | ICD-10-CM

## 2019-02-22 DIAGNOSIS — J309 Allergic rhinitis, unspecified: Secondary | ICD-10-CM | POA: Diagnosis not present

## 2019-02-22 DIAGNOSIS — M85851 Other specified disorders of bone density and structure, right thigh: Secondary | ICD-10-CM

## 2019-02-22 DIAGNOSIS — B351 Tinea unguium: Secondary | ICD-10-CM

## 2019-02-22 DIAGNOSIS — Z5181 Encounter for therapeutic drug level monitoring: Secondary | ICD-10-CM

## 2019-02-22 DIAGNOSIS — Z9989 Dependence on other enabling machines and devices: Secondary | ICD-10-CM

## 2019-02-22 DIAGNOSIS — K5900 Constipation, unspecified: Secondary | ICD-10-CM

## 2019-02-22 LAB — POCT URINALYSIS DIP (PROADVANTAGE DEVICE)
Bilirubin, UA: NEGATIVE
Glucose, UA: NEGATIVE mg/dL
Ketones, POC UA: NEGATIVE mg/dL
Leukocytes, UA: NEGATIVE
Nitrite, UA: NEGATIVE
Protein Ur, POC: NEGATIVE mg/dL
Specific Gravity, Urine: 1.025
Urobilinogen, Ur: NEGATIVE
pH, UA: 6 (ref 5.0–8.0)

## 2019-02-23 LAB — CBC WITH DIFFERENTIAL/PLATELET
Basophils Absolute: 0 10*3/uL (ref 0.0–0.2)
Basos: 1 %
EOS (ABSOLUTE): 0.1 10*3/uL (ref 0.0–0.4)
Eos: 3 %
Hematocrit: 43.6 % (ref 34.0–46.6)
Hemoglobin: 14.5 g/dL (ref 11.1–15.9)
Immature Grans (Abs): 0 10*3/uL (ref 0.0–0.1)
Immature Granulocytes: 0 %
Lymphocytes Absolute: 1.9 10*3/uL (ref 0.7–3.1)
Lymphs: 43 %
MCH: 30 pg (ref 26.6–33.0)
MCHC: 33.3 g/dL (ref 31.5–35.7)
MCV: 90 fL (ref 79–97)
Monocytes Absolute: 0.3 10*3/uL (ref 0.1–0.9)
Monocytes: 6 %
Neutrophils Absolute: 2.1 10*3/uL (ref 1.4–7.0)
Neutrophils: 47 %
Platelets: 184 10*3/uL (ref 150–450)
RBC: 4.83 x10E6/uL (ref 3.77–5.28)
RDW: 12.5 % (ref 11.7–15.4)
WBC: 4.5 10*3/uL (ref 3.4–10.8)

## 2019-02-23 LAB — COMPREHENSIVE METABOLIC PANEL
ALT: 24 IU/L (ref 0–32)
AST: 27 IU/L (ref 0–40)
Albumin/Globulin Ratio: 2 (ref 1.2–2.2)
Albumin: 4.5 g/dL (ref 3.8–4.8)
Alkaline Phosphatase: 103 IU/L (ref 39–117)
BUN/Creatinine Ratio: 16 (ref 12–28)
BUN: 14 mg/dL (ref 8–27)
Bilirubin Total: 0.6 mg/dL (ref 0.0–1.2)
CO2: 24 mmol/L (ref 20–29)
Calcium: 9.9 mg/dL (ref 8.7–10.3)
Chloride: 105 mmol/L (ref 96–106)
Creatinine, Ser: 0.88 mg/dL (ref 0.57–1.00)
GFR calc Af Amer: 79 mL/min/{1.73_m2} (ref >59)
GFR calc non Af Amer: 68 mL/min/{1.73_m2} (ref >59)
Globulin, Total: 2.2 g/dL (ref 1.5–4.5)
Glucose: 103 mg/dL — ABNORMAL HIGH (ref 65–99)
Potassium: 4.6 mmol/L (ref 3.5–5.2)
Sodium: 144 mmol/L (ref 134–144)
Total Protein: 6.7 g/dL (ref 6.0–8.5)

## 2019-02-23 LAB — LIPID PANEL
Chol/HDL Ratio: 3.4 ratio (ref 0.0–4.4)
Cholesterol, Total: 172 mg/dL (ref 100–199)
HDL: 50 mg/dL (ref >39)
LDL Chol Calc (NIH): 101 mg/dL — ABNORMAL HIGH (ref 0–99)
Triglycerides: 119 mg/dL (ref 0–149)
VLDL Cholesterol Cal: 21 mg/dL (ref 5–40)

## 2019-02-23 MED ORDER — ATORVASTATIN CALCIUM 10 MG PO TABS: ORAL_TABLET | ORAL | 3 refills | Status: DC

## 2019-02-23 NOTE — Addendum Note (Signed)
Addended by: Joselyn Arrow on: 02/23/2019 08:09 AM   Modules accepted: Orders

## 2019-09-21 ENCOUNTER — Other Ambulatory Visit: Payer: Self-pay | Admitting: Family Medicine

## 2019-09-21 DIAGNOSIS — Z1231 Encounter for screening mammogram for malignant neoplasm of breast: Secondary | ICD-10-CM

## 2019-09-28 ENCOUNTER — Other Ambulatory Visit: Payer: Self-pay

## 2019-09-28 ENCOUNTER — Other Ambulatory Visit (INDEPENDENT_AMBULATORY_CARE_PROVIDER_SITE_OTHER): Payer: Medicare Other

## 2019-09-28 DIAGNOSIS — Z23 Encounter for immunization: Secondary | ICD-10-CM | POA: Diagnosis not present

## 2019-11-03 ENCOUNTER — Other Ambulatory Visit: Payer: Self-pay

## 2019-11-03 ENCOUNTER — Ambulatory Visit
Admission: RE | Admit: 2019-11-03 | Discharge: 2019-11-03 | Disposition: A | Discharge status: Home / Self Care | Payer: Medicare Other | Source: Ambulatory Visit | Attending: Family Medicine | Admitting: Family Medicine

## 2019-11-03 DIAGNOSIS — Z1231 Encounter for screening mammogram for malignant neoplasm of breast: Secondary | ICD-10-CM

## 2019-11-09 ENCOUNTER — Telehealth: Payer: Self-pay | Admitting: Family Medicine

## 2019-11-09 NOTE — Telephone Encounter (Signed)
She had hers in Feb. I will ask about her husbands

## 2019-11-09 NOTE — Telephone Encounter (Signed)
pt wants to schedule her and her husband moderna booster for next Tuesday for her and wed for him. Just wanted to check with you first to make sure its ok before scheduling.

## 2019-11-09 NOTE — Telephone Encounter (Signed)
As long as they are both 6+ months out from the 2nd dose, it is fine.  They are both over 65.

## 2019-11-16 ENCOUNTER — Other Ambulatory Visit: Payer: Self-pay

## 2019-11-16 ENCOUNTER — Ambulatory Visit (INDEPENDENT_AMBULATORY_CARE_PROVIDER_SITE_OTHER): Payer: Medicare Other

## 2019-11-16 DIAGNOSIS — Z23 Encounter for immunization: Secondary | ICD-10-CM | POA: Diagnosis not present

## 2020-03-05 NOTE — Patient Instructions (Incomplete)
Discussed monthly self breast exams and yearly mammograms after the age of 42; at least 30 minutes of aerobic activity at least 5 days/week; proper sunscreen use reviewed; healthy diet, including goals of calcium and vitamin D intake and alcohol recommendations (less than or equal to 1 drink/day) reviewed; regular seatbelt use; changing batteries in smoke detectors.  Immunization recommendations discussed.  Colonoscopy recommendations reviewed  Kelli Zavala , Thank you for taking time to come for your Medicare Wellness Visit. I appreciate your ongoing commitment to your health goals. Please review the following plan we discussed and let me know if I can assist you in the future.    This is a list of the screening recommended for you and due dates:  Health Maintenance  Topic Date Due  . Mammogram  11/02/2020  . Tetanus Vaccine  10/08/2021  . Colon Cancer Screening  10/05/2023  . Flu Shot  Completed  . DEXA scan (bone density measurement)  Completed  . COVID-19 Vaccine  Completed  .  Hepatitis C: One time screening is recommended by Center for Disease Control  (CDC) for  adults born from [birth year] through 1964.   Completed  . Pneumonia vaccines  Completed   We should consider doing another bone density test in 1-2 years to follow up on the very mild osteopenia.
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
## 2020-03-06 ENCOUNTER — Ambulatory Visit (INDEPENDENT_AMBULATORY_CARE_PROVIDER_SITE_OTHER): Payer: Medicare Other | Admitting: Family Medicine

## 2020-03-06 ENCOUNTER — Encounter: Payer: Self-pay | Admitting: Family Medicine

## 2020-03-06 ENCOUNTER — Other Ambulatory Visit: Payer: Self-pay

## 2020-03-06 VITALS — BP 128/70 | HR 80 | Ht 63.5 in | Wt 172.0 lb

## 2020-03-06 DIAGNOSIS — G4733 Obstructive sleep apnea (adult) (pediatric): Secondary | ICD-10-CM | POA: Diagnosis not present

## 2020-03-06 DIAGNOSIS — Z9989 Dependence on other enabling machines and devices: Secondary | ICD-10-CM

## 2020-03-06 DIAGNOSIS — M85851 Other specified disorders of bone density and structure, right thigh: Secondary | ICD-10-CM

## 2020-03-06 DIAGNOSIS — E7800 Pure hypercholesterolemia, unspecified: Secondary | ICD-10-CM | POA: Diagnosis not present

## 2020-03-06 DIAGNOSIS — Z0000 Encounter for general adult medical examination without abnormal findings: Secondary | ICD-10-CM

## 2020-03-06 DIAGNOSIS — J309 Allergic rhinitis, unspecified: Secondary | ICD-10-CM | POA: Diagnosis not present

## 2020-03-06 DIAGNOSIS — Z5181 Encounter for therapeutic drug level monitoring: Secondary | ICD-10-CM

## 2020-03-06 LAB — CBC WITH DIFFERENTIAL/PLATELET
Basophils Absolute: 0 10*3/uL (ref 0.0–0.2)
Basos: 1 %
EOS (ABSOLUTE): 0.1 10*3/uL (ref 0.0–0.4)
Eos: 2 %
Hematocrit: 44.1 % (ref 34.0–46.6)
Hemoglobin: 14.8 g/dL (ref 11.1–15.9)
Immature Grans (Abs): 0 10*3/uL (ref 0.0–0.1)
Immature Granulocytes: 0 %
Lymphocytes Absolute: 1.5 10*3/uL (ref 0.7–3.1)
Lymphs: 34 %
MCH: 30.6 pg (ref 26.6–33.0)
MCHC: 33.6 g/dL (ref 31.5–35.7)
MCV: 91 fL (ref 79–97)
Monocytes Absolute: 0.3 10*3/uL (ref 0.1–0.9)
Monocytes: 6 %
Neutrophils Absolute: 2.4 10*3/uL (ref 1.4–7.0)
Neutrophils: 57 %
Platelets: 176 10*3/uL (ref 150–450)
RBC: 4.84 x10E6/uL (ref 3.77–5.28)
RDW: 12.4 % (ref 11.7–15.4)
WBC: 4.3 10*3/uL (ref 3.4–10.8)

## 2020-03-06 LAB — COMPREHENSIVE METABOLIC PANEL
ALT: 18 IU/L (ref 0–32)
AST: 18 IU/L (ref 0–40)
Albumin/Globulin Ratio: 1.8 (ref 1.2–2.2)
Albumin: 4.6 g/dL (ref 3.8–4.8)
Alkaline Phosphatase: 101 IU/L (ref 44–121)
BUN/Creatinine Ratio: 17 (ref 12–28)
BUN: 14 mg/dL (ref 8–27)
Bilirubin Total: 0.7 mg/dL (ref 0.0–1.2)
CO2: 22 mmol/L (ref 20–29)
Calcium: 9.8 mg/dL (ref 8.7–10.3)
Chloride: 103 mmol/L (ref 96–106)
Creatinine, Ser: 0.84 mg/dL (ref 0.57–1.00)
GFR calc Af Amer: 83 mL/min/{1.73_m2} (ref >59)
GFR calc non Af Amer: 72 mL/min/{1.73_m2} (ref >59)
Globulin, Total: 2.6 g/dL (ref 1.5–4.5)
Glucose: 97 mg/dL (ref 65–99)
Potassium: 4.3 mmol/L (ref 3.5–5.2)
Sodium: 142 mmol/L (ref 134–144)
Total Protein: 7.2 g/dL (ref 6.0–8.5)

## 2020-03-06 LAB — LIPID PANEL
Chol/HDL Ratio: 3.1 ratio (ref 0.0–4.4)
Cholesterol, Total: 160 mg/dL (ref 100–199)
HDL: 52 mg/dL (ref >39)
LDL Chol Calc (NIH): 92 mg/dL (ref 0–99)
Triglycerides: 88 mg/dL (ref 0–149)
VLDL Cholesterol Cal: 16 mg/dL (ref 5–40)

## 2020-03-07 MED ORDER — ATORVASTATIN CALCIUM 10 MG PO TABS: 10.0000 mg | ORAL_TABLET | Freq: Every day | ORAL | 3 refills | Status: DC

## 2020-03-24 ENCOUNTER — Telehealth: Payer: Medicare Other | Admitting: Medical

## 2020-03-24 ENCOUNTER — Other Ambulatory Visit: Payer: Self-pay

## 2020-03-24 ENCOUNTER — Encounter: Payer: Self-pay | Admitting: Medical

## 2020-03-24 ENCOUNTER — Encounter: Payer: Self-pay | Admitting: Family Medicine

## 2020-03-24 VITALS — Temp 97.0°F | Ht 63.5 in | Wt 170.0 lb

## 2020-03-24 DIAGNOSIS — J988 Other specified respiratory disorders: Secondary | ICD-10-CM | POA: Diagnosis not present

## 2020-03-24 MED ORDER — AMOXICILLIN 875 MG PO TABS: 875.0000 mg | ORAL_TABLET | Freq: Two times a day (BID) | ORAL | 0 refills | Status: DC

## 2020-03-24 NOTE — Progress Notes (Signed)
Subjective:     Patient ID: Kelli Zavala, female   DOB: 06/09/1950, 70 y.o.   MRN: [id number]  This visit type was conducted due to national recommendations for restrictions regarding the COVID-19 Pandemic (e.g. social distancing) in an effort to limit this patient's exposure and mitigate transmission in our community.  Due to their co-morbid illnesses, this patient is at least at moderate risk for complications without adequate follow up.  This format is felt to be most appropriate for this patient at this time.    Documentation for virtual audio and video telecommunications through [facility] encounter:  The patient was located at home. The provider was located in the office. The patient did consent to this visit and is aware of possible charges through their insurance for this visit.  The other persons participating in this telemedicine service were none. Time spent on call was 20 minutes and in review of previous records 20 minutes total.  This virtual service is not related to other E/M service within previous 7 days.   HPI Chief Complaint  Patient presents with  . Sinusitis    Started Tuesday with sneezing, next day had head congestion and runny nose, symptms continued through today.     Virtual consult for illness.  She notes 4 day hx/o sneezing, head congestion, runny nose, clear mucous drainage, some intermittent itchy eyes, feels a little run down.   Progressively worse as the week has continued.   Using sudafed OTC.  Coughing some.   No significant history of spring time allergies.  No hx/o lung disease, nonsmoker.   Mother has similar symptoms.  No recent covid test, but no concern for covid exposure.    Not prone to sinusitis.   No loss of taste or smell.   Past Medical History:  Diagnosis Date  . Allergic rhinitis, cause unspecified    seasonal  . GERD (gastroesophageal reflux disease)   . Osteopenia 04/2017   mild, R fem neck  . Pure hypercholesterolemia     Current Outpatient Medications on File Prior to Visit  Medication Sig Dispense Refill  . atorvastatin (LIPITOR) 10 MG tablet Take 1 tablet (10 mg total) by mouth daily. 90 tablet 3  . Calcium Carbonate-Vitamin D (CALCIUM 500 + D PO) Take by mouth.    . Esomeprazole Magnesium (NEXIUM 24HR PO) Take 1 capsule by mouth daily.    Marland Kitchen loratadine (CLARITIN) 10 MG tablet Take 10 mg by mouth daily.    . Multiple Vitamins-Minerals (WOMENS MULTIVITAMIN PO) Take 1 tablet by mouth daily.    . Olopatadine HCl (PATADAY OP) Apply 1 drop to eye daily. 1 drop into each eye daily    . vitamin C (ASCORBIC ACID) 500 MG tablet Take 500 mg by mouth daily.     No current facility-administered medications on file prior to visit.     Review of Systems As in subjective    Objective:   Physical Exam Due to coronavirus pandemic stay at home measures, patient visit was virtual and they were not examined in person.   Gen: nad No dyspnea , answers questions in complete sentences without labored breathing     Assessment:     Encounter Diagnosis  Name Primary?  Marland Kitchen Respiratory tract infection Yes       Plan:     discussed symptoms and concerns.  She feels strongly that symptoms not related to allergies and usually doesn't get spring allergy problems.  Discussed limitations of virtual consult.  Currently symptoms suggestive  of virtual URI.   Discussed supportive care, rest, hydration, c/t OTC congestion remedy.  Consider nasal saline.  Tylenol prn for pain, aches, fever.  Advised if worsening sinus pressure, thick mucous or progressive symptoms, can consider amoxicillin but currently we will use watch and wait approach.     Nyelle was seen today for sinusitis.  Diagnoses and all orders for this visit:  Respiratory tract infection  Other orders -     amoxicillin (AMOXIL) 875 MG tablet; Take 1 tablet (875 mg total) by mouth 2 (two) times daily.  f/ u prn

## 2020-03-27 ENCOUNTER — Other Ambulatory Visit: Payer: Self-pay

## 2020-05-09 ENCOUNTER — Other Ambulatory Visit: Payer: Self-pay | Admitting: Family Medicine

## 2020-05-09 DIAGNOSIS — E7800 Pure hypercholesterolemia, unspecified: Secondary | ICD-10-CM

## 2020-06-29 ENCOUNTER — Encounter: Payer: Self-pay | Admitting: Family Medicine

## 2020-06-29 ENCOUNTER — Telehealth: Payer: Medicare Other | Admitting: Family Medicine

## 2020-06-29 ENCOUNTER — Other Ambulatory Visit (INDEPENDENT_AMBULATORY_CARE_PROVIDER_SITE_OTHER): Payer: Medicare Other

## 2020-06-29 ENCOUNTER — Other Ambulatory Visit: Payer: Self-pay

## 2020-06-29 VITALS — BP 127/67 | HR 87 | Temp 97.4°F | Wt 164.0 lb

## 2020-06-29 DIAGNOSIS — R55 Syncope and collapse: Secondary | ICD-10-CM

## 2020-06-29 DIAGNOSIS — U071 COVID-19: Secondary | ICD-10-CM

## 2020-06-29 DIAGNOSIS — R059 Cough, unspecified: Secondary | ICD-10-CM

## 2020-06-29 LAB — POC COVID19 BINAXNOW: SARS Coronavirus 2 Ag: POSITIVE — AB

## 2020-06-29 MED ORDER — NIRMATRELVIR/RITONAVIR (PAXLOVID)TABLET: ORAL_TABLET | ORAL | 0 refills | Status: DC

## 2020-06-29 NOTE — Patient Instructions (Signed)
Stay well hydrated.  Drink water until your urine is pale yellow or clear. Continue claritin. Limit the sudafed--take only if you're having significant sinus pain/headache. You may continue Delsym at night. Use Mucinex (plain, if also taking Delsym, vs the DM version of mucinex and then NO Delsym). Eat a bland diet, avoid dairy for a few days.  As you already know, your COVID test was positive and we prescribed Paxlovid. First dose should be at least 12 hours after your last dose of atorvastatin, and don't take the statin for the 5 days that you are taking the COVID medication. You should remain isolated through Monday (days 1-5); if feeling better (no fever, respiratory symptoms are improving), then you may leave isolation but remain masked 100% of the time, on days 6-10 (Tuesday through Saturday). If not improved, you should isolate for the full 10 days.  Follow up if you are having any persistent or worsening symptoms--ongoing dizziness, fainting spells, worsening cough, shortness of breath, pain with breathing, high fevers, etc.

## 2020-06-29 NOTE — Progress Notes (Signed)
Start time: 3:01 End time: 3:27   Virtual Visit via telephone Note  I connected with Kelli Zavala on 06/29/20 by telephone and verified that I am speaking with the correct person using two identifiers. She declined video visit, stating she could only do phone visit.  Location: Patient: home Provider: office   I discussed the limitations of evaluation and management by telemedicine and the availability of in person appointments. The patient expressed understanding and agreed to proceed.  History of Present Illness:  Chief Complaint  Patient presents with   other    Telephone call only cold symptoms, cough, diarrhea, ST, passed out yesterday, got light headed again this morning 127/67    Yesterday she woke up with a head cold--runny nose, sore throat.  She has been taking generic phenylephrine (2 tablets three times yesterday, 2 today so far).  She reports that yesterday she took her regular meds this morning, and while fixing her lunch to go to work, while standing, she passed out. She slid down on the floor. Husband was in the other room, heard her fall. Wasn't out for long, acted normally. She is sore on the crown of her head.  Denies any swelling or goose-egg, but felt sore/bruised. Not currently sore to the touch. She recalls feeling lightheaded as she was standing, prior to passing out. She went back to bed, and felt better later that morning, was able to eat and drink.  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  11am she started feeling LH, went to break area, put head down on table.  Drank some water, ate some [employer] cups. 15 min later felt a little better. Took an early lunch, felt better, but left early.  Yesterday she had ST, resolved after sudafed. Today she still has ST. She is coughing, nonproductive. Nasal drainage is clear. No sinus headaches. She had 1 episode of diarrhea 10 am yesterday, nothing since. Some  nausea last night  She has a h/o prior syncope, due to BP dropping, felt similar.  No known COVID exposures  PMH, PSH, SH reviewed Outpatient Encounter Medications as of 06/29/2020  Medication Sig Note   atorvastatin (LIPITOR) 10 MG tablet Take 1 tablet (10 mg total) by mouth daily.    Calcium Carbonate-Vitamin D (CALCIUM 500 + D PO) Take by mouth.    Esomeprazole Magnesium (NEXIUM 24HR PO) Take 1 capsule by mouth daily. 07/03/2015: Takes OTC Nexium daily   loratadine (CLARITIN) 10 MG tablet Take 10 mg by mouth daily.    Multiple Vitamins-Minerals (WOMENS MULTIVITAMIN PO) Take 1 tablet by mouth daily.    Olopatadine HCl (PATADAY OP) Apply 1 drop to eye daily. 1 drop into each eye daily    vitamin C (ASCORBIC ACID) 500 MG tablet Take 500 mg by mouth daily.    amoxicillin (AMOXIL) 875 MG tablet Take 1 tablet (875 mg total) by mouth 2 (two) times daily. (Patient not taking: Reported on 06/29/2020)    No facility-administered encounter medications on file as of 06/29/2020.   Allergies  Allergen Reactions   Pollen Extract Cough and Other (See Comments)   Sunstick Lipstick [Digalloyl Trioleate] Other (See Comments)   ROS: no fever, chills. URI symptoms per HPI. Syncope yesterday. No other neuro symptoms.  No urinary symptoms, bleeding, bruising, rashes. Denies chest pain, shortness of breath. No loss of taste or smell Diarrhea yesterday, normal stool today.   Observations/Objective:  BP 127/67   Pulse 87  Temp (!) 97.4 F (36.3 C)   Wt 164 lb (74.4 kg)   BMI 28.60 kg/m   Patient sounds alert and oriented, normal speech. No coughing during visit, speaking easily. Exam limited due to virtual nature of the visit.  Rapid COVID test +  Assessment and Plan:  COVID-19 virus infection - Paxlovid, mucinex and delsym prn. f/u if sx persist/worsen. Reviewed proper isolation recommendations - Plan: nirmatrelvir/ritonavir EUA (PAXLOVID) TABS  Syncope, unspecified syncope type - suspect  some mild dehydration/hypotension. To push fluids, limit sudafed. Eval if persistent/worsening symptoms   Stay well hydrated.  Drink water until your urine is pale yellow or clear. Continue claritin. Limit the sudafed--take only if you're having significant sinus pain/headache. You may continue Delsym at night. Use Mucinex (plain, if also taking Delsym, vs the DM version of mucinex and then NO Delsym). Eat a bland diet, avoid dairy for a few days.  If COVID +, isolate today through Monday. If feeling better, Tues through Sat. If COVID is positive, we should treat with Paxlovid Must wait 12 hours after lipitor before starting Paxlovid. Do not take lipitor for the 5 days taking the medication.   Follow Up Instructions:    I discussed the assessment and treatment plan with the patient. The patient was provided an opportunity to ask questions and all were answered. The patient agreed with the plan and demonstrated an understanding of the instructions.   The patient was advised to call back or seek an in-person evaluation if the symptoms worsen or if the condition fails to improve as anticipated.  I provided 31 minutes of video face-to-face time during this encounter.  Additional time was spent in chart review and documentation.   Lavonda Jumbo, MD

## 2020-08-17 ENCOUNTER — Other Ambulatory Visit (HOSPITAL_COMMUNITY): Payer: Self-pay

## 2020-09-02 ENCOUNTER — Encounter: Payer: Self-pay | Admitting: Family Medicine

## 2020-09-22 ENCOUNTER — Other Ambulatory Visit: Payer: Self-pay | Admitting: Family Medicine

## 2020-09-22 DIAGNOSIS — Z1231 Encounter for screening mammogram for malignant neoplasm of breast: Secondary | ICD-10-CM

## 2020-09-27 ENCOUNTER — Encounter: Payer: Self-pay | Admitting: Family Medicine

## 2020-09-28 ENCOUNTER — Encounter: Payer: Self-pay | Admitting: *Deleted

## 2020-10-14 ENCOUNTER — Encounter: Payer: Self-pay | Admitting: Family Medicine

## 2020-10-24 ENCOUNTER — Other Ambulatory Visit: Payer: Self-pay

## 2020-10-24 ENCOUNTER — Ambulatory Visit (INDEPENDENT_AMBULATORY_CARE_PROVIDER_SITE_OTHER): Payer: Medicare Other

## 2020-10-24 DIAGNOSIS — Z23 Encounter for immunization: Secondary | ICD-10-CM | POA: Diagnosis not present

## 2020-11-07 ENCOUNTER — Other Ambulatory Visit: Payer: Self-pay

## 2020-11-07 ENCOUNTER — Ambulatory Visit
Admission: RE | Admit: 2020-11-07 | Discharge: 2020-11-07 | Disposition: A | Discharge status: Home / Self Care | Payer: Medicare Other | Source: Ambulatory Visit | Attending: Family Medicine | Admitting: Family Medicine

## 2020-11-07 DIAGNOSIS — Z1231 Encounter for screening mammogram for malignant neoplasm of breast: Secondary | ICD-10-CM

## 2020-11-11 ENCOUNTER — Other Ambulatory Visit: Payer: Self-pay | Admitting: Family Medicine

## 2020-11-11 DIAGNOSIS — E7800 Pure hypercholesterolemia, unspecified: Secondary | ICD-10-CM

## 2021-01-29 ENCOUNTER — Other Ambulatory Visit: Payer: Self-pay

## 2021-01-29 ENCOUNTER — Ambulatory Visit (HOSPITAL_COMMUNITY)
Admission: EM | Admit: 2021-01-29 | Discharge: 2021-01-29 | Disposition: A | Discharge status: Home / Self Care | Payer: Medicare Other | Attending: Internal Medicine | Admitting: Internal Medicine

## 2021-01-29 ENCOUNTER — Encounter (HOSPITAL_COMMUNITY): Payer: Self-pay

## 2021-01-29 DIAGNOSIS — R0789 Other chest pain: Secondary | ICD-10-CM

## 2021-01-29 NOTE — Discharge Instructions (Signed)
Entered range of motion exercises Heating pad use only 20 minutes on-20 minutes off cycle Please take Tylenol or Motrin as needed for pain Return to urgent care if symptoms worsen.

## 2021-01-29 NOTE — ED Provider Notes (Signed)
[place]    CSN: [id number] Arrival date & time: 01/29/21  0846      History   Chief Complaint Chief Complaint  Patient presents with   Chest Discomfort   Lip Numbness    HPI Kelli Zavala is a 71 y.o. female comes to the urgent care with right-sided chest discomfort for 3 days duration.  Patient had a mechanical fall last Friday.  She broke her fall on an outstretched right hand.  She describes the pain as sharp, on the right side of the chest, aggravated by palpation and movement of the right shoulder with no known relieving factors.  No bruising on the left shoulder or chest.  No shortness of breath or diaphoresis.  No palpitations.  No calf pain or long distance travel.  Patient denies any sore throat, chest tightness, cough or sputum production.Marland Kitchen   HPI  Past Medical History:  Diagnosis Date   Allergic rhinitis, cause unspecified    seasonal   GERD (gastroesophageal reflux disease)    Osteopenia 04/2017   mild, R fem neck   Pure hypercholesterolemia     Patient Active Problem List   Diagnosis Date Noted   Obstructive sleep apnea on CPAP 06/30/2018   Obstructive sleep apnea syndrome, severe 03/22/2018   Osteopenia of neck of right femur 02/17/2018   Gastroesophageal reflux disease 03/09/2015   Pure hypercholesterolemia 05/09/2011    History reviewed. No pertinent surgical history.  OB History   No obstetric history on file.      Home Medications    Prior to Admission medications   Medication Sig Start Date End Date Taking? Authorizing Provider  atorvastatin (LIPITOR) 10 MG tablet TAKE 1 TABLET(10 MG) BY MOUTH DAILY 11/13/20   Rita Ohara, MD  Calcium Carbonate-Vitamin D (CALCIUM 500 + D PO) Take by mouth.    [provider]  Esomeprazole Magnesium (NEXIUM 24HR PO) Take 1 capsule by mouth daily.    [provider]  loratadine (CLARITIN) 10 MG tablet Take 10 mg by mouth daily.    [provider]  Multiple  Vitamins-Minerals (WOMENS MULTIVITAMIN PO) Take 1 tablet by mouth daily.    [provider]  nirmatrelvir/ritonavir EUA (PAXLOVID) TABS Take nirmatrelvir 150 mg two tablets twice daily for 5 days and ritonavir 100 mg one tablet twice daily for 5 days. Patient GFR is >70 06/29/20   Rita Ohara, MD  Olopatadine HCl (PATADAY OP) Apply 1 drop to eye daily. 1 drop into each eye daily    [provider]  vitamin C (ASCORBIC ACID) 500 MG tablet Take 500 mg by mouth daily.    [provider]    Family History Family History  Problem Relation Age of Onset   Hyperlipidemia Mother    Hypothyroidism Mother    Parkinsonism Mother    Colon polyps Mother    Heart disease Mother 73       CABG x 2   Dementia Mother        mild   Osteoporosis Mother    Cancer Mother 60       breast   Breast cancer Mother    Depression Mother    Heart disease Father    Depression Father    Hypothyroidism Sister    Migraines Sister    Cerebral aneurysm Sister    Allergies Brother    Colon polyps Brother    Colon polyps Sister    Migraines Sister    Hypothyroidism Sister  Cancer Maternal Grandmother        in her back (bone?)   Heart disease Maternal Grandfather    Heart disease Paternal Grandmother    Diabetes Paternal Grandmother    Heart disease Paternal Grandfather     Social History Social History   Tobacco Use   Smoking status: Never   Smokeless tobacco: Never   Tobacco comments:    passive exposure (husband smokes outside)  Vaping Use   Vaping Use: Never used  Substance Use Topics   Alcohol use: Yes    Comment: 1 drink a week   Drug use: No     Allergies   Pollen extract and Sunstick lipstick [digalloyl trioleate]   Review of Systems Review of Systems As per HPI.  Physical Exam Triage Vital Signs ED Triage Vitals  Enc Vitals Group     BP 01/29/21 0908 (!) 156/89     Pulse Rate 01/29/21 0908 68     Resp 01/29/21 0908 17     Temp 01/29/21 0908 98.9 F  (37.2 C)     Temp Source 01/29/21 0908 Oral     SpO2 01/29/21 0908 97 %     Weight --      Height --      Head Circumference --      Peak Flow --      Pain Score 01/29/21 0912 4     Pain Loc --      Pain Edu? --      Excl. in [place]? --    No data found.  Updated Vital Signs BP (!) 156/89 (BP Location: Right Arm)    Pulse 68    Temp 98.9 F (37.2 C) (Oral)    Resp 17    SpO2 97%   Visual Acuity Right Eye Distance:   Left Eye Distance:   Bilateral Distance:    Right Eye Near:   Left Eye Near:    Bilateral Near:     Physical Exam Vitals and nursing note reviewed.  Constitutional:      General: She is not in acute distress.    Appearance: She is not ill-appearing.  Cardiovascular:     Rate and Rhythm: Normal rate and regular rhythm.     Pulses: Normal pulses.     Heart sounds: Normal heart sounds.  Pulmonary:     Effort: Pulmonary effort is normal.     Breath sounds: Normal breath sounds.     Comments: Tenderness on palpation of the right pectoralis major muscle.  Full range of motion of the right shoulder.  No bruising over the chest.  No masses palpated. Neurological:     Mental Status: She is alert.     UC Treatments / Results  Labs (all labs ordered are listed, but only abnormal results are displayed) Labs Reviewed - No data to display  EKG   Radiology No results found.  Procedures Procedures (including critical care time)  Medications Ordered in UC Medications - No data to display  Initial Impression / Assessment and Plan / UC Course  I have reviewed the triage vital signs and the nursing notes.  Pertinent labs & imaging results that were available during my care of the patient were reviewed by me and considered in my medical decision making (see chart for details).     1.  Chest wall pain: Gentle range of motion exercises Heating pad use will be helpful Tylenol Motrin as needed for pain Return precautions given. Final Clinical Impressions(s) /  UC Diagnoses   Final diagnoses:  Chest wall pain     Discharge Instructions      Entered range of motion exercises Heating pad use only 20 minutes on-20 minutes off cycle Please take Tylenol or Motrin as needed for pain Return to urgent care if symptoms worsen.   ED Prescriptions   None    PDMP not reviewed this encounter.   Chase Picket, MD 01/29/21 [id number]

## 2021-01-29 NOTE — ED Triage Notes (Signed)
Pt presents with right side chest discomfort near her shoulder and right shoulder blade pain on upper back and also some lip numbness last night; pt states she had a fall on Friday.

## 2021-03-09 ENCOUNTER — Encounter: Payer: Self-pay | Admitting: Family Medicine

## 2021-03-18 NOTE — Progress Notes (Signed)
Chief Complaint  Patient presents with   Medicare Wellness    Fasting AWV with pelvic. Has been having some sleep issues, waking at 3 or 4am and cannot get back to sleep. Uses CPAP and wonders if this is part of the problem. Asking about Inspire (cpap alternative). No other concerns.     Kelli Zavala is a 71 y.o. female who presents for annual physical exam, Medicare wellness visit and follow-up on chronic medical conditions.    Sleep apnea:  Severe OSA was diagnosed on 03/2018 sleep study. She is using her CPAP machine nightly.  She feels rested in the mornings (if she slept well). She continues to have some sleep issues, where she wakes up at 3-4 am and cannot get back to sleep. This has been more frequent over the last year.  Denies any pain waking her up. Sometimes she wakes up to go to the bathroom, sometimes she goes only because she is awake.  She has a hard time getting back to sleep. She will get up and send emails, play solitaire on the computer. Tries sleepytime tea, doesn't help. Tries to stop caffeine at 5pm.   She isn't waking up related to any discomfort from the CPAP. Admits she doesn't always put it back on after she gets up. She is inquiring about Inspire treatment.   Hyperlipidemia follow-up:  Patient is reportedly following a low-fat, low cholesterol diet. She has been taking atorvastatin.  She reports today noting more discomfort in her muscles in her thighs over the last year. She mentioned this to NP from [facility], who suggested she start Coenzyme Q10. She has been taking this and her leg discomfort resolved  She is due for recheck. Lab Results  Component Value Date   CHOL 160 03/06/2020   HDL 52 03/06/2020   LDLCALC 92 03/06/2020   TRIG 88 03/06/2020   CHOLHDL 3.1 03/06/2020    GERD: She no longer sees specialist for her reflux. She continues to do well on OTC Nexium daily.  Symptom was cough, not heartburn. Denies dysphagia.  Only rarely has a  cough.  Allergies:  PND was also a factor in her coughing.  She takes claritin daily. Denies cough, sinus pain.   Immunization History  Administered Date(s) Administered   Fluad Quad(high Dose 65+) 09/28/2019   Influenza, High Dose Seasonal PF 10/13/2016, 09/06/2018, 09/26/2020   Influenza-Unspecified 10/14/2012, 10/14/2013, 10/05/2015, 09/30/2017   Moderna Covid-19 Vaccine Bivalent Booster 9yrs & up 10/24/2020   Moderna Sars-Covid-2 Vaccination 01/27/2019, 02/25/2019, 11/16/2019   Pneumococcal Conjugate-13 02/17/2017   Pneumococcal Polysaccharide-23 02/18/2018   Tdap 10/09/2011   Zoster Recombinat (Shingrix) 07/09/2016, 11/14/2016   Zoster, Live 10/09/2011   Last Pap smear: 02/2017 normal, no high risk HPV Last mammogram: 10/2020 Last colonoscopy: 09/2013, Dr. Earlean Shawl.  10 year f/u rec Last DEXA: 04/2017 T-1.3 at R fem neck Dentist: twice a year Ophtho: yearly Exercise:  Walks 3 flights of stairs on the days she works. Yoga once a week at the [facility], no other exercises there (used to walk the track). Calcium--glass of milk each evening, plus Ca and MVI, and some yogurt.  Sees Dr. Delman Cheadle yearly for routine skin checks. Normal vitamin D-OH (47) in 06/2015. Started Women's MVI daily since then   Patient Care Team: Rita Ohara, MD as PCP - General (Family Medicine) Dentist: Dr. Sharyn Lull [place] Ophtho: Dr. Peter Garter Derm: Dr. Jari Pigg GYN: Dr. Harrington Challenger (retired) GI: Dr. Earlean Shawl Ortho: Dr Berenice Primas   Depression Screening: Flowsheet Row Office Visit  from 03/19/2021 in [place]  PHQ-2 Total Score 0        Falls screen:  Fall Risk  03/19/2021 03/06/2020 02/22/2019 02/18/2018 02/17/2017  Falls in the past year? 1 0 1 0 No  Number falls in past yr: 1 - 0 - -  Comment 2 falls. Tripped over baby gate, and another where she caught herself with her hands. - today, this morning when she went to get breakfast @ [employer] - -  Injury with Fall? 0 - 1 - -  Comment no injuries - brusing to  left elbow - -  Risk for fall due to : History of fall(s) - - - -  Follow up Falls evaluation completed - - - -     Functional Status Survey: Is the patient deaf or have difficulty hearing?: No Does the patient have difficulty seeing, even when wearing glasses/contacts?: No Does the patient have difficulty concentrating, remembering, or making decisions?: No Does the patient have difficulty walking or climbing stairs?: No Does the patient have difficulty dressing or bathing?: No Does the patient have difficulty doing errands alone such as visiting a doctor's office or shopping?: No  Mini-Cog Scoring: 5    End of Life Discussion:  Patient has a living will and medical power of attorney   PMH, [place], [place] and FH reviewed and updated  Outpatient Encounter Medications as of 03/19/2021  Medication Sig Note   atorvastatin (LIPITOR) 10 MG tablet TAKE 1 TABLET(10 MG) BY MOUTH DAILY    Calcium Carbonate-Vitamin D (CALCIUM 500 + D PO) Take by mouth.    Coenzyme Q10 (COQ10) 100 MG CAPS Take 1 capsule by mouth.    loratadine (CLARITIN) 10 MG tablet Take 10 mg by mouth daily.    Multiple Vitamins-Minerals (WOMENS MULTIVITAMIN PO) Take 1 tablet by mouth daily.    Olopatadine HCl (PATADAY OP) Apply 1 drop to eye daily. 1 drop into each eye daily    vitamin C (ASCORBIC ACID) 500 MG tablet Take 500 mg by mouth daily.    Esomeprazole Magnesium (NEXIUM 24HR PO) Take 1 capsule by mouth daily. 07/03/2015: Takes OTC Nexium daily   [DISCONTINUED] nirmatrelvir/ritonavir EUA (PAXLOVID) TABS Take nirmatrelvir 150 mg two tablets twice daily for 5 days and ritonavir 100 mg one tablet twice daily for 5 days. Patient GFR is >70    No facility-administered encounter medications on file as of 03/19/2021.   Allergies  Allergen Reactions   Pollen Extract Cough and Other (See Comments)   Sunstick Lipstick [Digalloyl Trioleate] Other (See Comments)     ROS:  The patient denies anorexia, fever, headaches, decreased  hearing, ear pain, sore throat, chest pain, palpitations, dizziness, syncope, dyspnea on exertion, swelling, nausea, vomiting, diarrhea, abdominal pain, melena, hematochezia, indigestion/heartburn, urinary complaints, vaginal bleeding, discharge, odor, itch or genital lesions.. Denies joint pains, numbness, tingling, weakness, tremor, suspicious skin lesions, depression, anxiety, abnormal bleeding/bruising, or enlarged lymph nodes. Allergies--controlled with the claritin and pataday daily. No ophthalmic migraines in a few years Only intermittent constipation.    PHYSICAL EXAM:  BP 120/70    Pulse 80    Ht 5\' 3"  (1.6 m)    Wt 168 lb 3.2 oz (76.3 kg)    BMI 29.80 kg/m   Wt Readings from Last 3 Encounters:  03/19/21 168 lb 3.2 oz (76.3 kg)  06/29/20 164 lb (74.4 kg)  03/24/20 170 lb (77.1 kg)    General Appearance:    Alert, cooperative, no distress, appears stated age.  Head:    Normocephalic, without obvious abnormality, atraumatic  Eyes:    PERRL, conjunctiva/corneas clear, EOM's intact, fundi benign  Ears:    Normal TM's and external ear canals  Nose:   Not examined, wearing mask due to COVID-19 pandemic  Throat:   Not examined, wearing mask due to COVID-19 pandemic  Neck:   Supple, no lymphadenopathy;  thyroid:  no enlargement/tenderness/ nodules; no carotid bruit or JVD  Back:    Spine nontender, no curvature, ROM normal, no CVA tenderness  Lungs:     Clear to auscultation bilaterally without wheezes, rales or ronchi; respirations unlabored  Chest Wall:    No tenderness or deformity   Heart:    Regular rate and rhythm, S1 and S2 normal, no murmur, rub or gallop  Breast Exam:    No tenderness, masses, or nipple discharge or inversion.  No axillary lymphadenopathy  Abdomen:     Soft, non-tender, nondistended, normoactive bowel sounds, no masses, no hepatosplenomegaly  Genitalia:    Normal external genitalia without lesions.  BUS and vagina normal;  no cervical motion tenderness. No  abnormal vaginal discharge.  Uterus and adnexa not enlarged, nontender, no masses.  Pap not performed.  Rectal:    Normal tone, no masses or tenderness; guaiac negative stool  Extremities:   No clubbing, cyanosis or edema.   Pulses:   2+ and symmetric all extremities  Skin:   Skin color, texture, turgor normal, no rashes or lesions. Left great toenail--entire nail is discolored (h/o trauma from biopsy at base of toenail); R great toenail has proximal 1/2 appearing normal, distal half is discolored.   Lymph nodes:   Cervical, supraclavicular, inguinal and axillary nodes normal  Neurologic:   Normal strength, sensation and gait; reflexes 2+ and symmetric throughout                              Psych:   Normal mood, affect, hygiene and grooming.   ASSESSMENT/PLAN:  Annual physical exam - Plan: Lipid panel, Comprehensive metabolic panel, CBC with Differential/Platelet  Medicare annual wellness visit, subsequent  Pure hypercholesterolemia - cont statin, low cholesterol diet - Plan: Lipid panel  Insomnia, unspecified type - early awakening. Ddx reviewed. To stop caffeine earlier, get regular exercise. Sleep hygiene reviewed. f/u if not improving  Obstructive sleep apnea on CPAP - continue CPAP. Weight loss recommended  Osteopenia of neck of right femur - discussed calcium, D and weight bearing exercise. Repeat DEXA next year (with mammo)  Allergic rhinitis, unspecified seasonality, unspecified trigger - continue antihistamine, pataday as needed  Gastroesophageal reflux disease without esophagitis - controlled with OTC Nexium daily. Wt loss encouraged  Medication monitoring encounter - Plan: Lipid panel, Comprehensive metabolic panel, CBC with Differential/Platelet  BMI 29.0-29.9,adult - counseled on diet, exercise, wt loss recommended.  Counseled in detail regarding sleep issues. Will work on daily exercise, cutting back caffeine earlier in the day. Discussed what she can do when she wakes  up (avoid blue light, discussed breathing/visualization techniques). Discussed Dawna Part (pt not interested, when learned it was a surgical procedure). Education re: healthy diet, exercise, weight loss, calcium, weight-bearing exercise. All of her questions were answered  Lipitor RF not needed until end of April/May   Discussed monthly self breast exams and yearly mammograms; at least 30 minutes of aerobic activity at least 5 days/week and weight-bearing exercise 2x/week; proper sunscreen use reviewed; healthy diet, including goals of calcium and vitamin D intake and  alcohol recommendations (less than or equal to 1 drink/day) reviewed; regular seatbelt use; changing batteries in smoke detectors.  Immunization recommendations discussed--continue yearly high dose flu shots. TdaP in 09/2021 (to get from pharm).  Colonoscopy recommendations reviewed, UTD, due 2025. Repeat DEXA next year (with mammo 10/2022)  MOST form reviewed and updated.  Full Code, Full Care  F/u 1 year, sooner prn.  Medicare Attestation I have personally reviewed: The patient's medical and social history Their use of alcohol, tobacco or illicit drugs Their current medications and supplements The patient's functional ability including ADLs,fall risks, home safety risks, cognitive, and hearing and visual impairment Diet and physical activities Evidence for depression or mood disorders  The patient's weight, height, BMI have been recorded in the chart.  I have made referrals, counseling, and provided education to the patient based on review of the above and I have provided the patient with a written personalized care plan for preventive services.

## 2021-03-18 NOTE — Patient Instructions (Addendum)
?  HEALTH MAINTENANCE RECOMMENDATIONS: ? ?It is recommended that you get at least 30 minutes of aerobic exercise at least 5 days/week (for weight loss, you may need as much as 60-90 minutes). This can be any activity that gets your heart rate up. This can be divided in 10-15 minute intervals if needed, but try and build up your endurance at least once a week.  Weight bearing exercise is also recommended twice weekly. ? ?Eat a healthy diet with lots of vegetables, fruits and fiber.  "Colorful" foods have a lot of vitamins (ie green vegetables, tomatoes, red peppers, etc).  Limit sweet tea, regular sodas and alcoholic beverages, all of which has a lot of calories and sugar.  Up to 1 alcoholic drink daily may be beneficial for women (unless trying to lose weight, watch sugars).  Drink a lot of water. ? ?Calcium recommendations are 1200-1500 mg daily (1500 mg for postmenopausal women or women without ovaries), and vitamin D 1000 IU daily.  This should be obtained from diet and/or supplements (vitamins), and calcium should not be taken all at once, but in divided doses. ? ?Monthly self breast exams and yearly mammograms for women over the age of 38 is recommended. ? ?Sunscreen of at least SPF 30 should be used on all sun-exposed parts of the skin when outside between the hours of 10 am and 4 pm (not just when at beach or pool, but even with exercise, golf, tennis, and yard work!)  Use a sunscreen that says "broad spectrum" so it covers both UVA and UVB rays, and make sure to reapply every 1-2 hours. ? ?Remember to change the batteries in your smoke detectors when changing your clock times in the spring and fall. Carbon monoxide detectors are recommended for your home. ? ?Use your seat belt every time you are in a car, and please drive safely and not be distracted with cell phones and texting while driving. ? ? ?Ms. Gangi , ?Thank you for taking time to come for your Medicare Wellness Visit. I appreciate your ongoing  commitment to your health goals. Please review the following plan we discussed and let me know if I can assist you in the future.  ? ?This is a list of the screening recommended for you and due dates:  ?Health Maintenance  ?Topic Date Due  ? Tetanus Vaccine  10/08/2021  ? Mammogram  11/07/2021  ? Colon Cancer Screening  10/05/2023  ? Pneumonia Vaccine  Completed  ? Flu Shot  Completed  ? DEXA scan (bone density measurement)  Completed  ? COVID-19 Vaccine  Completed  ? Hepatitis C Screening: USPSTF Recommendation to screen - Ages 44-79 yo.  Completed  ? Zoster (Shingles) Vaccine  Completed  ? HPV Vaccine  Aged Out  ? ?Get the tetanus booster (TdaP) from your pharmacy in September. ?I think we should do another bone density test next year (you had very mild thinning of the bones in 2019; 5 year recheck is a good idea). ? ?Please cut off your caffeine off earlier in the day (1-2 pm). ?

## 2021-03-19 ENCOUNTER — Ambulatory Visit: Payer: Medicare Other | Admitting: Family Medicine

## 2021-03-19 ENCOUNTER — Encounter: Payer: Self-pay | Admitting: Family Medicine

## 2021-03-19 VITALS — BP 120/70 | HR 80 | Ht 63.0 in | Wt 168.2 lb

## 2021-03-19 DIAGNOSIS — G4700 Insomnia, unspecified: Secondary | ICD-10-CM | POA: Diagnosis not present

## 2021-03-19 DIAGNOSIS — K219 Gastro-esophageal reflux disease without esophagitis: Secondary | ICD-10-CM

## 2021-03-19 DIAGNOSIS — G4733 Obstructive sleep apnea (adult) (pediatric): Secondary | ICD-10-CM | POA: Diagnosis not present

## 2021-03-19 DIAGNOSIS — E7800 Pure hypercholesterolemia, unspecified: Secondary | ICD-10-CM | POA: Diagnosis not present

## 2021-03-19 DIAGNOSIS — Z0000 Encounter for general adult medical examination without abnormal findings: Secondary | ICD-10-CM | POA: Diagnosis not present

## 2021-03-19 DIAGNOSIS — Z9989 Dependence on other enabling machines and devices: Secondary | ICD-10-CM

## 2021-03-19 DIAGNOSIS — M85851 Other specified disorders of bone density and structure, right thigh: Secondary | ICD-10-CM

## 2021-03-19 DIAGNOSIS — Z6829 Body mass index (BMI) 29.0-29.9, adult: Secondary | ICD-10-CM

## 2021-03-19 DIAGNOSIS — J309 Allergic rhinitis, unspecified: Secondary | ICD-10-CM

## 2021-03-19 DIAGNOSIS — Z5181 Encounter for therapeutic drug level monitoring: Secondary | ICD-10-CM

## 2021-03-19 LAB — CBC WITH DIFFERENTIAL/PLATELET

## 2021-03-20 LAB — COMPREHENSIVE METABOLIC PANEL
ALT: 18 IU/L (ref 0–32)
AST: 17 IU/L (ref 0–40)
Albumin/Globulin Ratio: 2 (ref 1.2–2.2)
Albumin: 4.7 g/dL (ref 3.8–4.8)
Alkaline Phosphatase: 103 IU/L (ref 44–121)
BUN/Creatinine Ratio: 19 (ref 12–28)
BUN: 18 mg/dL (ref 8–27)
Bilirubin Total: 0.7 mg/dL (ref 0.0–1.2)
CO2: 22 mmol/L (ref 20–29)
Calcium: 9.7 mg/dL (ref 8.7–10.3)
Chloride: 107 mmol/L — ABNORMAL HIGH (ref 96–106)
Creatinine, Ser: 0.96 mg/dL (ref 0.57–1.00)
Globulin, Total: 2.4 g/dL (ref 1.5–4.5)
Glucose: 105 mg/dL — ABNORMAL HIGH (ref 70–99)
Potassium: 4.6 mmol/L (ref 3.5–5.2)
Sodium: 143 mmol/L (ref 134–144)
Total Protein: 7.1 g/dL (ref 6.0–8.5)
eGFR: 64 mL/min/{1.73_m2} (ref >59)

## 2021-03-20 LAB — CBC WITH DIFFERENTIAL/PLATELET
Basophils Absolute: 0.1 10*3/uL (ref 0.0–0.2)
Basos: 1 %
EOS (ABSOLUTE): 0.1 10*3/uL (ref 0.0–0.4)
Eos: 3 %
Hematocrit: 43.9 % (ref 34.0–46.6)
Hemoglobin: 15.2 g/dL (ref 11.1–15.9)
Immature Grans (Abs): 0 10*3/uL (ref 0.0–0.1)
Immature Granulocytes: 0 %
Lymphocytes Absolute: 1.9 10*3/uL (ref 0.7–3.1)
Lymphs: 37 %
MCH: 31.5 pg (ref 26.6–33.0)
MCHC: 34.6 g/dL (ref 31.5–35.7)
MCV: 91 fL (ref 79–97)
Monocytes Absolute: 0.3 10*3/uL (ref 0.1–0.9)
Monocytes: 7 %
Neutrophils Absolute: 2.6 10*3/uL (ref 1.4–7.0)
Neutrophils: 52 %
Platelets: 194 10*3/uL (ref 150–450)
RBC: 4.83 x10E6/uL (ref 3.77–5.28)
RDW: 12.1 % (ref 11.7–15.4)
WBC: 5.1 10*3/uL (ref 3.4–10.8)

## 2021-03-20 LAB — LIPID PANEL
Chol/HDL Ratio: 3.2 ratio (ref 0.0–4.4)
Cholesterol, Total: 172 mg/dL (ref 100–199)
HDL: 54 mg/dL (ref >39)
LDL Chol Calc (NIH): 99 mg/dL (ref 0–99)
Triglycerides: 106 mg/dL (ref 0–149)
VLDL Cholesterol Cal: 19 mg/dL (ref 5–40)

## 2021-04-09 ENCOUNTER — Encounter: Payer: Self-pay | Admitting: Family Medicine

## 2021-04-09 ENCOUNTER — Ambulatory Visit: Payer: Medicare Other | Admitting: Family Medicine

## 2021-04-09 VITALS — Temp 96.8°F | Ht 63.0 in | Wt 168.0 lb

## 2021-04-09 DIAGNOSIS — J302 Other seasonal allergic rhinitis: Secondary | ICD-10-CM | POA: Diagnosis not present

## 2021-04-09 NOTE — Progress Notes (Signed)
Start time: 9:51 ?End time:  10:04 ? ?Virtual Visit via Telephone Note ? ?I connected with Kelli Zavala on 04/09/21 by telephone and verified that I am speaking with the correct person using two identifiers. ?Patient reported inability to do video visit. ? ?Location: ?Patient: work ?Provider: office ?  ?I discussed the limitations of evaluation and management by telemedicine and the availability of in person appointments. The patient expressed understanding and agreed to proceed. ? ?History of Present Illness: ? ?Chief Complaint  ?Patient presents with  ? Nasal Congestion  ?  PHONE CALL has had a runny nose and congestion for over two weeks. Has a slight cough from time to time. Clear drainage. No fever, chills or body aches. Has not taken any covid tests.   ? ?Clear runny nose x 2 weeks, with some congestion.  Slight cough, which she relates to reflux.. No purulent drainage, no shortness of breath.  ?She denies sinus pain.  +occasional sneezing. ?Runny nose, especially if she doesn't take the sudafed. ?She takes claritin daily for a few years.  She changed from Zyrtec 2-3 years ago. Zyrtec made her too sleepy, could only take 1/2 tablet. ? ?She has used nasal steroids in the past, has not taken in a long time. ? ?PMH, PSH, SH reviewed ? ?Outpatient Encounter Medications as of 04/09/2021  ?Medication Sig Note  ? atorvastatin (LIPITOR) 10 MG tablet TAKE 1 TABLET(10 MG) BY MOUTH DAILY   ? Calcium Carbonate-Vitamin D (CALCIUM 500 + D PO) Take by mouth.   ? Coenzyme Q10 (COQ10) 100 MG CAPS Take 1 capsule by mouth.   ? Esomeprazole Magnesium (NEXIUM 24HR PO) Take 1 capsule by mouth daily. 07/03/2015: Takes OTC Nexium daily  ? loratadine (CLARITIN) 10 MG tablet Take 10 mg by mouth daily.   ? Multiple Vitamins-Minerals (WOMENS MULTIVITAMIN PO) Take 1 tablet by mouth daily.   ? Olopatadine HCl (PATADAY OP) Apply 1 drop to eye daily. 1 drop into each eye daily   ? phenylephrine (SUDAFED PE) 10 MG TABS tablet Take 10 mg by  mouth every 4 (four) hours as needed. 04/09/2021: Last dose 7am  ? vitamin C (ASCORBIC ACID) 500 MG tablet Take 500 mg by mouth daily.   ? ?No facility-administered encounter medications on file as of 04/09/2021.  ? ?Allergies  ?Allergen Reactions  ? Pollen Extract Cough and Other (See Comments)  ? Sunstick Lipstick [Digalloyl Trioleate] Other (See Comments)  ? ?ROS: no fever, chills, sinus pain, sore throat, shortness of breath, chest pain, n/v/d or other complaints. No headaches, dizziness. See HPI. ? ?  ?Observations/Objective: ? ?Temp (!) 96.8 ?F (36 ?C) (Oral)   Ht 5\' 3"  (1.6 m)   Wt 168 lb (76.2 kg)   BMI 29.76 kg/m?  ? ?Patient is alert and oriented.  Doesn't sound congested, no coughing or throat-clearing during visit, no hoarseness noted. ?Exam is limited due to the virtual nature of the visit. ? ?Assessment and Plan: ? ?Seasonal allergies - cont claritin, sudafed prn (if BP remains normal). Add nasal steroid.  Reviewed how to use properly.  Can call for Rx if less expensive than OTC ? ?Cont antihistamine (either continue with Claritin, or try switching to Allegra when you need more). ?I would add an inhaled nasal steroid (Flonase, Nasonex, Nasacort, etc).  Most of these are now over-the-counter. ?Remember to use gentle sniffs, 2 sprays into each nostril (or per package). ?You may continue your sudafed as needed, as long as blood pressure is fine and  no side effects. ?You likely won't need this often once the nasal steroid takes full effect. ? ?Contact us if you develop fever, discolored mucus, sinus pain, or other new symptoms. ? ? ? ?Follow Up Instructions: ? ?  ?I discussed the assessment and treatment plan with the patient. The patient was provided an opportunity to ask questions and all were answered. The patient agreed with the plan and demonstrated an understanding of the instructions. ?  ?The patient was advised to call back or seek an in-person evaluation if the symptoms worsen or if the condition  fails to improve as anticipated. ? ?I spent 16 minutes dedicated to the care of this patient, including pre-visit review of records, face to face time, post-visit ordering of testing and documentation. ? ? ? ?Lavonda Jumbo, MD ?

## 2021-04-09 NOTE — Patient Instructions (Signed)
Cont antihistamine (either continue with Claritin, or try switching to Allegra when you need more). ?I would add an inhaled nasal steroid (Flonase, Nasonex, Nasacort, etc).  Most of these are now over-the-counter. ?Remember to use gentle sniffs, 2 sprays into each nostril (or per package). ?You may continue your sudafed as needed, as long as blood pressure is fine and no side effects. ?You likely won't need this often once the nasal steroid takes full effect. ? ?Contact us if you develop fever, discolored mucus, sinus pain, or other new symptoms. ?

## 2021-04-13 ENCOUNTER — Encounter: Payer: Self-pay | Admitting: Physician Assistant

## 2021-04-13 ENCOUNTER — Telehealth: Payer: Medicare Other | Admitting: Physician Assistant

## 2021-04-13 ENCOUNTER — Encounter: Payer: Self-pay | Admitting: Family Medicine

## 2021-04-13 VITALS — Temp 97.1°F | Wt 168.0 lb

## 2021-04-13 DIAGNOSIS — J302 Other seasonal allergic rhinitis: Secondary | ICD-10-CM | POA: Diagnosis not present

## 2021-04-13 MED ORDER — MONTELUKAST SODIUM 10 MG PO TABS: 10.0000 mg | ORAL_TABLET | Freq: Every day | ORAL | 3 refills | Status: DC

## 2021-04-13 NOTE — Telephone Encounter (Signed)
I just got off the phone for a visit with her. All taken care of.

## 2021-04-13 NOTE — Progress Notes (Signed)
Start time: 3:35 pm ?End time: 3:55 pm ? ?Virtual Visit via Video Note ? ? Patient ID: Kelli Zavala, female    DOB: [birth year], 71 y.o.   MRN: [id number] ? ?I connected with above patient on 04/13/21 by a video enabled telemedicine application and verified that I am speaking with the correct person using two identifiers. ? ?Location: ?Patient: home ?Provider: office ?  ?I discussed the limitations of evaluation and management by telemedicine and the availability of in person appointments. The patient expressed understanding and agreed to proceed. ? ?History of Present Illness: ? ?Chief Complaint  ?Patient presents with  ? Sore Throat  ?  Started weds of this week and all day   ? ?Patient reports since her visit on 04/09/2021 with Dr. Tomi Bamberger she has been using Sudafed as needed, Flonase, and Claritin, but her symptoms are the same and now she has a mild sore throat; states she can swallow, eat food, and drink liquids; denies sick contacts; denies cough, denies headache, denies facial pressure or pain, denies fever / chills / nausea / vomiting / diarrhea / constipation. ? ?  ?Observations/Objective: ? ?Temp (!) 97.1 ?F (36.2 ?C)   Wt 168 lb (76.2 kg)   BMI 29.76 kg/m?  ? ? ?Assessment: ?Encounter Diagnosis  ?Name Primary?  ? Seasonal allergies Yes  ? ? ? ?Plan: ?Continue Claritin or Allegra, Flonase, can also do nasal saline rinses; add Singulair; if symptoms don't improve, call back; may need referral to Allergist for further evaluation and allergy testing.  ? ?Kelli Zavala was seen today for sore throat. ? ?Diagnoses and all orders for this visit: ? ?Seasonal allergies ? ?Other orders ?-     montelukast (SINGULAIR) 10 MG tablet; Take 1 tablet (10 mg total) by mouth at bedtime. ? ? ? ?Follow up: as needed ? ? ?I discussed the assessment and treatment plan with the patient. The patient was provided an opportunity to ask questions and all were answered. The patient agreed with the plan and demonstrated an understanding of  the instructions. ?  ?The patient was advised to call back or seek an in-person evaluation if the symptoms worsen or if the condition fails to improve as anticipated. For emergencies go to Urgent Care or the Emergency Department for immediate evaluation.  ? ?I spent 15 minutes dedicated to the care of this patient, including pre-visit review of records, face to face time, post-visit ordering of testing and documentation. ? ? ? ?Irene Pap, PA-C ?

## 2021-04-14 ENCOUNTER — Ambulatory Visit (HOSPITAL_COMMUNITY)
Admission: EM | Admit: 2021-04-14 | Discharge: 2021-04-14 | Disposition: A | Discharge status: Home / Self Care | Payer: Medicare Other | Attending: Nurse Practitioner | Admitting: Nurse Practitioner

## 2021-04-14 ENCOUNTER — Encounter (HOSPITAL_COMMUNITY): Payer: Self-pay | Admitting: Emergency Medicine

## 2021-04-14 ENCOUNTER — Encounter: Payer: Self-pay | Admitting: Family Medicine

## 2021-04-14 DIAGNOSIS — J029 Acute pharyngitis, unspecified: Secondary | ICD-10-CM

## 2021-04-14 LAB — POC INFLUENZA A AND B ANTIGEN (URGENT CARE ONLY)
INFLUENZA A ANTIGEN, POC: NEGATIVE
INFLUENZA B ANTIGEN, POC: NEGATIVE

## 2021-04-14 LAB — POCT RAPID STREP A, ED / UC
Streptococcus, Group A Screen (Direct): NEGATIVE
Streptococcus, Group A Screen (Direct): NEGATIVE

## 2021-04-14 MED ORDER — LIDOCAINE VISCOUS HCL 2 % MT SOLN: 5.0000 mL | Freq: Three times a day (TID) | OROMUCOSAL | 0 refills | Status: AC | PRN

## 2021-04-14 NOTE — Discharge Instructions (Addendum)
Your rapid strep and influenza test are negative today.  A throat culture has been ordered.  You will be contacted if your results are positive to discuss treatment. ?Supportive care to include increasing fluids and getting plenty of rest. ?May take ibuprofen every 8 hours to help with pain, inflammation, and swelling. ?Warm salt water gargles 3-4 times daily to help with pain or discomfort. ?Continue use of Chloraseptic spray for comfort. ?Follow-up with your primary care physician in the next 5 to 7 days if symptoms do not improve. ?

## 2021-04-14 NOTE — ED Triage Notes (Signed)
Pt c/o sore throat since Wed. Repots had two virtual visits this week and told allergy related. Home covid negative. Was prescribed Singular.  ?

## 2021-04-14 NOTE — ED Provider Notes (Addendum)
?[facility] ? ? ? ?CSN: [id number] ?Arrival date & time: 04/14/21  1239 ? ? ?  ? ?History   ?Chief Complaint ?Chief Complaint  ?Patient presents with  ? Sore Throat  ? ? ?HPI ?Kelli Zavala is a 71 y.o. female.  ? ?The patient is a 71 year old female who presents for sore throat.  Patient states symptoms started over the past week.  States that she completed to virtual visits and was told her symptoms were related to seasonal allergies.  States that her last visit was completed 1 day ago, and she was prescribed Singulair.  States she took that medicine last night.  Today she states worsening throat pain.  She denies worsening of nasal congestion or runny nose.  She denies any recent sick contacts or exposures, fever, chills, abdominal pain, nausea or vomiting.  States that she does have a history of strep throat, although its been a while.  She has been using Chloraseptic throat spray for her symptoms. ? ?The history is provided by the patient.  ? ?Past Medical History:  ?Diagnosis Date  ? Allergic rhinitis, cause unspecified   ? seasonal  ? GERD (gastroesophageal reflux disease)   ? Osteopenia 04/2017  ? mild, R fem neck  ? Pure hypercholesterolemia   ? ? ?Patient Active Problem List  ? Diagnosis Date Noted  ? Obstructive sleep apnea on CPAP 06/30/2018  ? Obstructive sleep apnea syndrome, severe 03/22/2018  ? Osteopenia of neck of right femur 02/17/2018  ? Gastroesophageal reflux disease 03/09/2015  ? Pure hypercholesterolemia 05/09/2011  ? ? ?History reviewed. No pertinent surgical history. ? ?OB History   ?No obstetric history on file. ?  ? ? ? ?Home Medications   ? ?Prior to Admission medications   ?Medication Sig Start Date End Date Taking? Authorizing Provider  ?atorvastatin (LIPITOR) 10 MG tablet TAKE 1 TABLET(10 MG) BY MOUTH DAILY 11/13/20   Joselyn Arrow, MD  ?Calcium Carbonate-Vitamin D (CALCIUM 500 + D PO) Take by mouth.    [provider]  ?Coenzyme Q10 (COQ10) 100 MG CAPS Take 1 capsule  by mouth.    [provider]  ?Esomeprazole Magnesium (NEXIUM 24HR PO) Take 1 capsule by mouth daily.    [provider]  ?loratadine (CLARITIN) 10 MG tablet Take 10 mg by mouth daily.    [provider]  ?montelukast (SINGULAIR) 10 MG tablet Take 1 tablet (10 mg total) by mouth at bedtime. 04/13/21   Jake Shark, PA-C  ?Multiple Vitamins-Minerals (WOMENS MULTIVITAMIN PO) Take 1 tablet by mouth daily.    [provider]  ?Olopatadine HCl (PATADAY OP) Apply 1 drop to eye daily. 1 drop into each eye daily    [provider]  ?phenylephrine (SUDAFED PE) 10 MG TABS tablet Take 10 mg by mouth every 4 (four) hours as needed.    [provider]  ?vitamin C (ASCORBIC ACID) 500 MG tablet Take 500 mg by mouth daily.    [provider]  ? ? ?Family History ?Family History  ?Problem Relation Age of Onset  ? Hyperlipidemia Mother   ? Hypothyroidism Mother   ? Parkinsonism Mother   ? Colon polyps Mother   ? Heart disease Mother 49  ?     CABG x 2  ? Dementia Mother   ?     mild  ? Osteoporosis Mother   ? Cancer Mother 40  ?     breast  ? Breast cancer Mother   ? Depression Mother   ?  Heart disease Father   ? Depression Father   ? Hypothyroidism Sister   ? Migraines Sister   ? Cerebral aneurysm Sister   ? Colon polyps Sister   ? Migraines Sister   ? Hypothyroidism Sister   ? Allergies Brother   ? Colon polyps Brother   ? Cancer Maternal Grandmother   ?     in her back (bone?)  ? Heart disease Maternal Grandfather   ? Heart disease Paternal Grandmother   ? Diabetes Paternal Grandmother   ? Heart disease Paternal Grandfather   ? ? ?Social History ?Social History  ? ?Tobacco Use  ? Smoking status: Never  ? Smokeless tobacco: Never  ? Tobacco comments:  ?  passive exposure (husband smokes outside)  ?Vaping Use  ? Vaping Use: Never used  ?Substance Use Topics  ? Alcohol use: Yes  ?  Comment: 1 drink a week  ? Drug use: No  ? ? ? ?Allergies   ?Pollen extract and  Sunstick lipstick [digalloyl trioleate] ? ? ?Review of Systems ?Review of Systems  ?Constitutional: Negative.   ?HENT:  Positive for congestion and sore throat. Negative for rhinorrhea and trouble swallowing.   ?Eyes: Negative.   ?Respiratory: Negative.    ?Cardiovascular: Negative.   ?Gastrointestinal: Negative.   ?Skin: Negative.   ?Psychiatric/Behavioral: Negative.    ? ? ?Physical Exam ?Triage Vital Signs ?ED Triage Vitals  ?Enc Vitals Group  ?   BP 04/14/21 1439 124/66  ?   Pulse Rate 04/14/21 1439 78  ?   Resp 04/14/21 1439 17  ?   Temp 04/14/21 1439 98.4 ?F (36.9 ?C)  ?   Temp Source 04/14/21 1439 Oral  ?   SpO2 04/14/21 1439 99 %  ?   Weight --   ?   Height --   ?   Head Circumference --   ?   Peak Flow --   ?   Pain Score 04/14/21 1438 6  ?   Pain Loc --   ?   Pain Edu? --   ?   Excl. in GC? --   ? ?No data found. ? ?Updated Vital Signs ?BP 124/66 (BP Location: Right Arm)   Pulse 78   Temp 98.4 ?F (36.9 ?C) (Oral)   Resp 17   SpO2 99%  ? ?Visual Acuity ?Right Eye Distance:   ?Left Eye Distance:   ?Bilateral Distance:   ? ?Right Eye Near:   ?Left Eye Near:    ?Bilateral Near:    ? ?Physical Exam ?Vitals reviewed.  ?Constitutional:   ?   General: She is not in acute distress. ?   Appearance: She is well-developed.  ?HENT:  ?   Head: Normocephalic and atraumatic.  ?   Right Ear: Tympanic membrane and ear canal normal.  ?   Left Ear: Tympanic membrane and ear canal normal.  ?   Nose: No congestion or rhinorrhea.  ?   Mouth/Throat:  ?   Mouth: Mucous membranes are moist.  ?   Pharynx: Pharyngeal swelling and posterior oropharyngeal erythema present. No uvula swelling.  ?   Tonsils: 1+ on the right. 1+ on the left.  ?Eyes:  ?   Conjunctiva/sclera: Conjunctivae normal.  ?   Pupils: Pupils are equal, round, and reactive to light.  ?Cardiovascular:  ?   Rate and Rhythm: Normal rate and regular rhythm.  ?   Heart sounds: Normal heart sounds.  ?Pulmonary:  ?   Effort: Pulmonary effort is normal.  ?  Breath  sounds: Normal breath sounds.  ?Abdominal:  ?   General: Bowel sounds are normal.  ?   Palpations: Abdomen is soft.  ?Musculoskeletal:  ?   Cervical back: Normal range of motion and neck supple.  ?Skin: ?   General: Skin is warm and dry.  ?   Capillary Refill: Capillary refill takes less than 2 seconds.  ?Neurological:  ?   General: No focal deficit present.  ?   Mental Status: She is alert and oriented to person, place, and time.  ? ? ? ?UC Treatments / Results  ?Labs ?(all labs ordered are listed, but only abnormal results are displayed) ?Labs Reviewed  ?POCT RAPID STREP A, ED / UC  ?POCT RAPID STREP A, ED / UC  ?POC INFLUENZA A AND B ANTIGEN (URGENT CARE ONLY)  ? ? ?EKG ? ? ?Radiology ?No results found. ? ?Procedures ?Procedures (including critical care time) ? ?Medications Ordered in UC ?Medications - No data to display ? ?Initial Impression / Assessment and Plan / UC Course  ?I have reviewed the triage vital signs and the nursing notes. ? ?Pertinent labs & imaging results that were available during my care of the patient were reviewed by me and considered in my medical decision making (see chart for details). ? ?The patient is a 71 year old female who presents for sore throat.  Symptoms have been present over the past week.  States that she has had worsening throat pain over the past 24 hours.  She has completed 2 virtual visits and was informed her symptoms are related to allergic rhinitis.  She presents today for concern for strep throat.  On exam, the patient does have erythema and swelling to her tonsils, +1 bilaterally, however no exudate.  Strep and influenza test were performed.  Both tests were negative today.  Throat culture was ordered for confirmatory testing.  Patient will be contacted if her results are positive.  She also does not have any other systemic symptoms to include fever, chills, abdominal pain, headache, nausea or vomiting.  Symptoms do appear to be consistent with an allergic rhinitis  or are of viral etiology.  Patient informed to continue supportive care to include increasing rest and getting plenty of fluids.  Recommend continuing Singulair at this time.  Will prescribe the patient viscous lidoca

## 2021-04-17 LAB — CULTURE, GROUP A STREP (THRC)

## 2021-05-19 ENCOUNTER — Other Ambulatory Visit: Payer: Self-pay | Admitting: Family Medicine

## 2021-05-19 DIAGNOSIS — E7800 Pure hypercholesterolemia, unspecified: Secondary | ICD-10-CM

## 2021-08-06 ENCOUNTER — Other Ambulatory Visit: Payer: Self-pay | Admitting: Physician Assistant

## 2021-09-19 ENCOUNTER — Encounter: Payer: Self-pay | Admitting: Internal Medicine

## 2021-09-25 ENCOUNTER — Encounter: Payer: Self-pay | Admitting: Family Medicine

## 2021-09-25 ENCOUNTER — Other Ambulatory Visit: Payer: Self-pay | Admitting: Family Medicine

## 2021-09-25 DIAGNOSIS — Z1231 Encounter for screening mammogram for malignant neoplasm of breast: Secondary | ICD-10-CM

## 2021-10-23 ENCOUNTER — Encounter: Payer: Self-pay | Admitting: Internal Medicine

## 2021-11-06 ENCOUNTER — Other Ambulatory Visit (INDEPENDENT_AMBULATORY_CARE_PROVIDER_SITE_OTHER): Payer: Medicare Other

## 2021-11-06 DIAGNOSIS — Z23 Encounter for immunization: Secondary | ICD-10-CM

## 2021-11-09 ENCOUNTER — Ambulatory Visit
Admission: RE | Admit: 2021-11-09 | Discharge: 2021-11-09 | Disposition: A | Discharge status: Home / Self Care | Payer: Medicare Other | Source: Ambulatory Visit | Attending: Family Medicine | Admitting: Family Medicine

## 2021-11-09 DIAGNOSIS — Z1231 Encounter for screening mammogram for malignant neoplasm of breast: Secondary | ICD-10-CM

## 2022-02-03 ENCOUNTER — Other Ambulatory Visit: Payer: Self-pay | Admitting: Family Medicine

## 2022-02-03 DIAGNOSIS — E7800 Pure hypercholesterolemia, unspecified: Secondary | ICD-10-CM

## 2022-02-04 ENCOUNTER — Encounter: Payer: Self-pay | Admitting: Family Medicine

## 2022-03-24 NOTE — Patient Instructions (Incomplete)
HEALTH MAINTENANCE RECOMMENDATIONS:  It is recommended that you get at least 30 minutes of aerobic exercise at least 5 days/week (for weight loss, you may need as much as 60-90 minutes). This can be any activity that gets your heart rate up. This can be divided in 10-15 minute intervals if needed, but try and build up your endurance at least once a week.  Weight bearing exercise is also recommended twice weekly.  Eat a healthy diet with lots of vegetables, fruits and fiber.  "Colorful" foods have a lot of vitamins (ie green vegetables, tomatoes, red peppers, etc).  Limit sweet tea, regular sodas and alcoholic beverages, all of which has a lot of calories and sugar.  Up to 1 alcoholic drink daily may be beneficial for women (unless trying to lose weight, watch sugars).  Drink a lot of water.  Calcium recommendations are 1200-1500 mg daily (1500 mg for postmenopausal women or women without ovaries), and vitamin D 1000 IU daily.  This should be obtained from diet and/or supplements (vitamins), and calcium should not be taken all at once, but in divided doses.  Monthly self breast exams and yearly mammograms for women over the age of 42 is recommended.  Sunscreen of at least SPF 30 should be used on all sun-exposed parts of the skin when outside between the hours of 10 am and 4 pm (not just when at beach or pool, but even with exercise, golf, tennis, and yard work!)  Use a sunscreen that says "broad spectrum" so it covers both UVA and UVB rays, and make sure to reapply every 1-2 hours.  Remember to change the batteries in your smoke detectors when changing your clock times in the spring and fall. Carbon monoxide detectors are recommended for your home.  Use your seat belt every time you are in a car, and please drive safely and not be distracted with cell phones and texting while driving.   Ms. Hartenstine , Thank you for taking time to come for your Medicare Wellness Visit. I appreciate your ongoing  commitment to your health goals. Please review the following plan we discussed and let me know if I can assist you in the future.   This is a list of the screening recommended for you and due dates:  Health Maintenance  Topic Date Due   DTaP/Tdap/Td vaccine (2 - Td or Tdap) 10/08/2021   COVID-19 Vaccine (6 - 2023-24 season) 01/01/2022   Mammogram  11/10/2022   Medicare Annual Wellness Visit  03/25/2023   Colon Cancer Screening  10/05/2023   Pneumonia Vaccine  Completed   Flu Shot  Completed   DEXA scan (bone density measurement)  Completed   Hepatitis C Screening: USPSTF Recommendation to screen - Ages 77-79 yo.  Completed   Zoster (Shingles) Vaccine  Completed   HPV Vaccine  Aged Out   COVID booster was given today. Please get TdaP from the pharmacy--you should wait 2 weeks from today's vaccine before getting it.  I ordered your bone density test.  You will need [facility]--it is okay to wait until October, and do it the same time as your mammogram (call in July to schedule these).  We discussed potential risks of longterm PPI (Nexium), and that famotidine (Pepcid) might be a better choice, if it is effective.  You may want to also see if you need a medication every day, vs just with known triggering foods (eating late, tomato sauces, spicy food, caffeine, etc).  Since your symptom was cough and not heartburn, you may want to delay skipping a medication until after the spring allergies are improved, but you can try Pepcid twice daily instead.  If your allergies flare, despite using the Allegra and montelukast, you can add back the Flonase, and guaifenesin (Mucinex) as needed.  Please do NOT use Qtips. Cut back on bread, pasta, potatoes (carbs) in your diet. Limit sugar in your diet, especially in your beverages (regular soda, in your tea). Increase your exercise as we discussed. Please work on losing some weight, for your overall health (by healthier  diet as we discussed, and getting the minimum of 150 minutes of aerobic activity each week).

## 2022-03-24 NOTE — Progress Notes (Unsigned)
No chief complaint on file.   Kelli Zavala is a 72 y.o. female who presents for annual physical exam, Medicare wellness visit and follow-up on chronic medical conditions.    Sleep apnea:  Severe OSA was diagnosed on 03/2018 sleep study. She is using her CPAP machine nightly.    Last year-- UPDATE She feels rested in the mornings (if she slept well). She continues to have some sleep issues, where she wakes up at 3-4 am and cannot get back to sleep. This has been more frequent over the last year.  Denies any pain waking her up. Sometimes she wakes up to go to the bathroom, sometimes she goes only because she is awake.  She has a hard time getting back to sleep. She will get up and send emails, play solitaire on the computer. Tries sleepytime tea, doesn't help. Tries to stop caffeine at 5pm.   She isn't waking up related to any discomfort from the CPAP. Admits she doesn't always put it back on after she gets up.    Hyperlipidemia follow-up:  Patient is reportedly following a low-fat, low cholesterol diet. She is compliant with taking atorvastatin. In the past she noted some myalgias, which resolved when she started taking Coenzyme Q10. She denies any changes to her diet.  She is due for recheck. Lab Results  Component Value Date   CHOL 172 03/19/2021   HDL 54 03/19/2021   LDLCALC 99 03/19/2021   TRIG 106 03/19/2021   CHOLHDL 3.2 03/19/2021    GERD: She no longer sees specialist for her reflux. She continues to do well on OTC Nexium daily.  Symptom was cough, not heartburn. Denies dysphagia.  Only rarely has a cough.  Allergies:  PND was also a factor in her coughing.   She takes Human resources officer daily.   Last Spring she had significant allergies despite oral antihistamine (using claritin at that time) and Flonase, and NP had also prescribed montelukast. UPDATE  Denies cough, sinus pain.   Immunization History  Administered Date(s) Administered   COVID-19, mRNA, vaccine(Comirnaty)12 years  and older 11/06/2021   Fluad Quad(high Dose 65+) 09/28/2019   Influenza, High Dose Seasonal PF 10/13/2016, 09/06/2018, 09/26/2020, 09/25/2021   Influenza-Unspecified 10/14/2012, 10/14/2013, 10/05/2015, 09/30/2017   Moderna Covid-19 Vaccine Bivalent Booster 50yr & up 10/24/2020   Moderna Sars-Covid-2 Vaccination 01/27/2019, 02/25/2019, 11/16/2019   Pneumococcal Conjugate-13 02/17/2017   Pneumococcal Polysaccharide-23 02/18/2018   RSV IGIV 09/25/2021   Tdap 10/09/2011   Zoster Recombinat (Shingrix) 07/09/2016, 11/14/2016   Zoster, Live 10/09/2011   Last Pap smear: 02/2017 normal, no high risk HPV Last mammogram: 10/2021 Last colonoscopy: 09/2013, Dr. MEarlean Shawl  10 year f/u rec Last DEXA: 04/2017 T-1.3 at R fem neck Dentist: twice a year Ophtho: yearly Exercise:   Walks 3 flights of stairs on the days she works. [facility] other [facility] (used to walk the track).  Calcium--glass of milk each evening, plus Ca and MVI, and some yogurt.  Normal vitamin D-OH (47) in 06/2015. Started Women's MVI daily since then   Patient Care Team: KRita Ohara MD as PCP - General (Family Medicine) Dentist: Dr. MSharyn LullMottinger Ophtho: Dr. KPeter GarterDerm: Dr. KJari PiggGYN: Dr. RHarrington Challenger(retired) GI: Dr. MEarlean ShawlOrtho: Dr GBerenice Primas  Depression Screening: Flowsheet Row Office Visit [place] 03[place] PHQ-2 Total Score 0        Falls screen:     03/19/2021    8:19 AM 03/06/2020  8:75 AM 02/22/2019    9:41 AM 02/18/2018    9:16 AM 02/17/2017    9:46 AM  Fall Risk   Falls in the past year? 1 0 1 0 No  Number falls in past yr: 1  0    Comment 2 falls. Tripped over baby gate, and another where she caught herself with her hands.  today, this morning when she went to get breakfast @ [employer]    Injury with Fall? 0  1    Comment no injuries  brusing to left elbow    Risk for fall due to : History of fall(s)      Follow up Falls evaluation completed          Functional Status Survey:         End of Life Discussion:  Patient has a living will and medical power of attorney   PMH, PSH, SH and FH reviewed and updated    ROS:  The patient denies anorexia, fever, headaches, decreased hearing, ear pain, sore throat, chest pain, palpitations, dizziness, syncope, dyspnea on exertion, swelling, nausea, vomiting, diarrhea, abdominal pain, melena, hematochezia, indigestion/heartburn, urinary complaints, vaginal bleeding, discharge, odor, itch or genital lesions.. Denies joint pains, numbness, tingling, weakness, tremor, suspicious skin lesions, depression, anxiety, abnormal bleeding/bruising, or enlarged lymph nodes.  Allergies--controlled with the claritin and pataday daily. No ophthalmic migraines in a few years Only intermittent constipation. Sees Dr. Delman Cheadle yearly for routine skin checks.    PHYSICAL EXAM:  There were no vitals taken for this visit.  Wt Readings from Last 3 Encounters:  04/13/21 168 lb (76.2 kg)  04/09/21 168 lb (76.2 kg)  03/19/21 168 lb 3.2 oz (76.3 kg)    General Appearance:    Alert, cooperative, no distress, appears stated age.   Head:    Normocephalic, without obvious abnormality, atraumatic  Eyes:    PERRL, conjunctiva/corneas clear, EOM's intact, fundi benign  Ears:    Normal TM's and external ear canals  Nose:   No drainage or sinus tenderness  Throat:   Normal mucosa  Neck:   Supple, no lymphadenopathy;  thyroid:  no enlargement/tenderness/ nodules; no carotid bruit or JVD  Back:    Spine nontender, no curvature, ROM normal, no CVA tenderness  Lungs:     Clear to auscultation bilaterally without wheezes, rales or ronchi; respirations unlabored  Chest Wall:    No tenderness or deformity   Heart:    Regular rate and rhythm, S1 and S2 normal, no murmur, rub or gallop  Breast Exam:    No tenderness, masses, or nipple discharge or inversion.  No axillary lymphadenopathy  Abdomen:     Soft, non-tender,  nondistended, normoactive bowel sounds, no masses, no hepatosplenomegaly  Genitalia:    Normal external genitalia without lesions.  BUS and vagina normal;  no cervical motion tenderness. No abnormal vaginal discharge.  Uterus and adnexa not enlarged, nontender, no masses.  Pap not performed.  Rectal:    Normal tone, no masses or tenderness; guaiac negative stool  Extremities:   No clubbing, cyanosis or edema.   Pulses:   2+ and symmetric all extremities  Skin:   Skin color, texture, turgor normal, no rashes or lesions. Left great toenail--entire nail is discolored (h/o trauma from biopsy at base of toenail); R great toenail has proximal 1/2 appearing normal, distal half is discolored.   Lymph nodes:   Cervical, supraclavicular, inguinal and axillary nodes normal  Neurologic:   Normal strength, sensation and gait;  reflexes 2+ and symmetric throughout                              Psych:   Normal mood, affect, hygiene and grooming.   ***UPDATE toenails  ASSESSMENT/PLAN:  Did she get Tdap from pharmacy? Other vaccines UTD.  Eligible for another COVID booster, per new/recent recs, if desired.  Place order for DEXA ([facility]), expected 10/2022, to do when next mammo is due).  No further pap smears are needed (last was done at age 36).  It pt uncomfortable with this, can do 1 last pap smear today.  Does she need lipitor RF??  Cbc, c-met, lipids TSH if sx, D if change in supplements  Last year-- Counseled in detail regarding sleep issues. Will work on daily exercise, cutting back caffeine earlier in the day. Discussed what she can do when she wakes up (avoid blue light, discussed breathing/visualization techniques).   Discussed monthly self breast exams and yearly mammograms; at least 30 minutes of aerobic activity at least 5 days/week and weight-bearing exercise 2x/week; proper sunscreen use reviewed; healthy diet, including goals of calcium and vitamin D intake and alcohol recommendations  (less than or equal to 1 drink/day) reviewed; regular seatbelt use; changing batteries in smoke detectors.  Immunization recommendations discussed--continue yearly high dose flu shots.  TdaP  COVID booster is UTD, new/recent recommendation for additional booster discussed. Colonoscopy recommendations reviewed, UTD, due 2025. Repeat DEXA next year (with mammo 10/2022)  MOST form reviewed and updated.  Full Code, Full Care  F/u 1 year, sooner prn.  Medicare Attestation I have personally reviewed: The patient's medical and social history Their use of alcohol, tobacco or illicit drugs Their current medications and supplements The patient's functional ability including ADLs,fall risks, home safety risks, cognitive, and hearing and visual impairment Diet and physical activities Evidence for depression or mood disorders  The patient's weight, height, BMI have been recorded in the chart.  I have made referrals, counseling, and provided education to the patient based on review of the above and I have provided the patient with a written personalized care plan for preventive services.

## 2022-03-25 ENCOUNTER — Encounter: Payer: Self-pay | Admitting: Family Medicine

## 2022-03-25 ENCOUNTER — Ambulatory Visit (INDEPENDENT_AMBULATORY_CARE_PROVIDER_SITE_OTHER): Payer: Medicare Other | Admitting: Family Medicine

## 2022-03-25 VITALS — BP 110/70 | HR 60 | Ht 64.0 in | Wt 175.6 lb

## 2022-03-25 DIAGNOSIS — J302 Other seasonal allergic rhinitis: Secondary | ICD-10-CM

## 2022-03-25 DIAGNOSIS — Z23 Encounter for immunization: Secondary | ICD-10-CM

## 2022-03-25 DIAGNOSIS — G4733 Obstructive sleep apnea (adult) (pediatric): Secondary | ICD-10-CM

## 2022-03-25 DIAGNOSIS — E7800 Pure hypercholesterolemia, unspecified: Secondary | ICD-10-CM

## 2022-03-25 DIAGNOSIS — Z6830 Body mass index (BMI) 30.0-30.9, adult: Secondary | ICD-10-CM

## 2022-03-25 DIAGNOSIS — I498 Other specified cardiac arrhythmias: Secondary | ICD-10-CM

## 2022-03-25 DIAGNOSIS — M85851 Other specified disorders of bone density and structure, right thigh: Secondary | ICD-10-CM

## 2022-03-25 DIAGNOSIS — K219 Gastro-esophageal reflux disease without esophagitis: Secondary | ICD-10-CM

## 2022-03-25 DIAGNOSIS — Z0000 Encounter for general adult medical examination without abnormal findings: Secondary | ICD-10-CM

## 2022-03-25 DIAGNOSIS — Z5181 Encounter for therapeutic drug level monitoring: Secondary | ICD-10-CM

## 2022-03-25 DIAGNOSIS — R635 Abnormal weight gain: Secondary | ICD-10-CM

## 2022-03-26 ENCOUNTER — Other Ambulatory Visit: Payer: Self-pay | Admitting: Family Medicine

## 2022-03-26 DIAGNOSIS — Z1231 Encounter for screening mammogram for malignant neoplasm of breast: Secondary | ICD-10-CM

## 2022-03-26 LAB — COMPREHENSIVE METABOLIC PANEL
ALT: 23 IU/L (ref 0–32)
AST: 18 IU/L (ref 0–40)
Albumin/Globulin Ratio: 1.9 (ref 1.2–2.2)
Albumin: 4.6 g/dL (ref 3.9–4.9)
Alkaline Phosphatase: 121 IU/L (ref 44–121)
BUN/Creatinine Ratio: 11 — ABNORMAL LOW (ref 12–28)
BUN: 10 mg/dL (ref 8–27)
Bilirubin Total: 0.7 mg/dL (ref 0.0–1.2)
CO2: 25 mmol/L (ref 20–29)
Calcium: 10 mg/dL (ref 8.7–10.3)
Chloride: 103 mmol/L (ref 96–106)
Creatinine, Ser: 0.89 mg/dL (ref 0.57–1.00)
Globulin, Total: 2.4 g/dL (ref 1.5–4.5)
Glucose: 86 mg/dL (ref 70–99)
Potassium: 4.3 mmol/L (ref 3.5–5.2)
Sodium: 143 mmol/L (ref 134–144)
Total Protein: 7 g/dL (ref 6.0–8.5)
eGFR: 70 mL/min/{1.73_m2} (ref >59)

## 2022-03-26 LAB — CBC WITH DIFFERENTIAL/PLATELET
Basophils Absolute: 0.1 10*3/uL (ref 0.0–0.2)
Basos: 1 %
EOS (ABSOLUTE): 0.2 10*3/uL (ref 0.0–0.4)
Eos: 3 %
Hematocrit: 46.1 % (ref 34.0–46.6)
Hemoglobin: 15.3 g/dL (ref 11.1–15.9)
Immature Grans (Abs): 0 10*3/uL (ref 0.0–0.1)
Immature Granulocytes: 0 %
Lymphocytes Absolute: 2.4 10*3/uL (ref 0.7–3.1)
Lymphs: 41 %
MCH: 30.4 pg (ref 26.6–33.0)
MCHC: 33.2 g/dL (ref 31.5–35.7)
MCV: 92 fL (ref 79–97)
Monocytes Absolute: 0.4 10*3/uL (ref 0.1–0.9)
Monocytes: 7 %
Neutrophils Absolute: 2.8 10*3/uL (ref 1.4–7.0)
Neutrophils: 48 %
Platelets: 212 10*3/uL (ref 150–450)
RBC: 5.03 x10E6/uL (ref 3.77–5.28)
RDW: 12.2 % (ref 11.7–15.4)
WBC: 5.9 10*3/uL (ref 3.4–10.8)

## 2022-03-26 LAB — LIPID PANEL
Chol/HDL Ratio: 3.6 ratio (ref 0.0–4.4)
Cholesterol, Total: 166 mg/dL (ref 100–199)
HDL: 46 mg/dL (ref >39)
LDL Chol Calc (NIH): 81 mg/dL (ref 0–99)
Triglycerides: 234 mg/dL — ABNORMAL HIGH (ref 0–149)
VLDL Cholesterol Cal: 39 mg/dL (ref 5–40)

## 2022-03-26 LAB — TSH: TSH: 2.52 u[IU]/mL (ref 0.450–4.500)

## 2022-03-26 MED ORDER — ATORVASTATIN CALCIUM 10 MG PO TABS: ORAL_TABLET | ORAL | 3 refills | Status: DC

## 2022-03-26 NOTE — Addendum Note (Signed)
Addended by: Rita Ohara on: 03/26/2022 09:21 AM   Modules accepted: Orders

## 2022-04-09 ENCOUNTER — Encounter: Payer: Self-pay | Admitting: Family Medicine

## 2022-04-09 ENCOUNTER — Encounter: Payer: Self-pay | Admitting: *Deleted

## 2022-04-13 IMAGING — MG MM DIGITAL SCREENING BILAT W/ TOMO AND CAD
8 series · 8 of 24 positions shown · non-contrast
Comparison: Previous exam(s).

CLINICAL DATA: Screening.

EXAM:
DIGITAL SCREENING BILATERAL MAMMOGRAM WITH TOMOSYNTHESIS AND CAD
TECHNIQUE: Bilateral screening digital craniocaudal and mediolateral oblique
mammograms were obtained. Bilateral screening digital breast
tomosynthesis was performed. The images were evaluated with
computer-aided detection.

[L CC synth-2D]
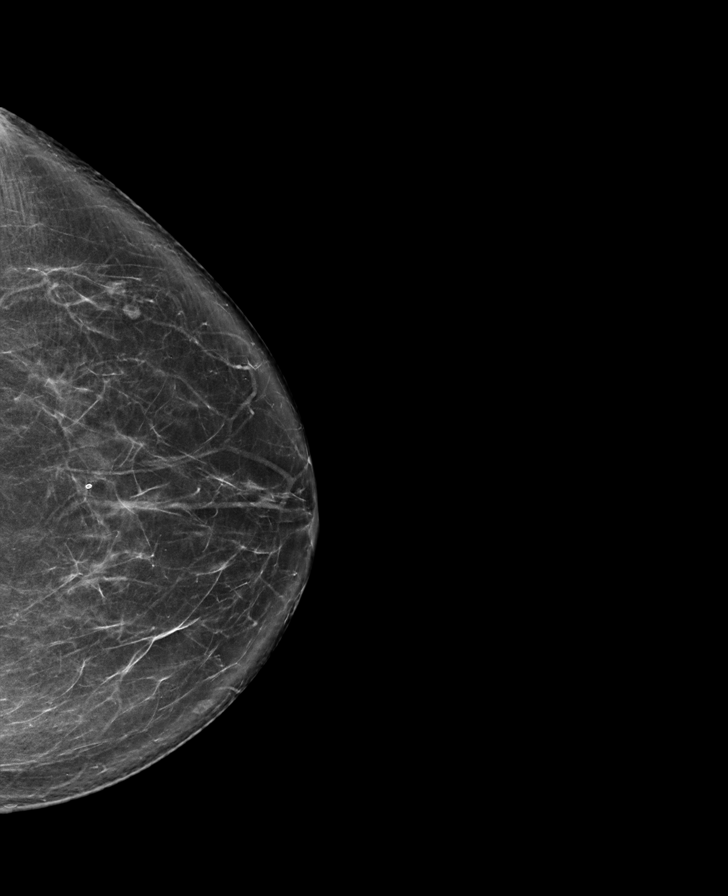

[R CC synth-2D]
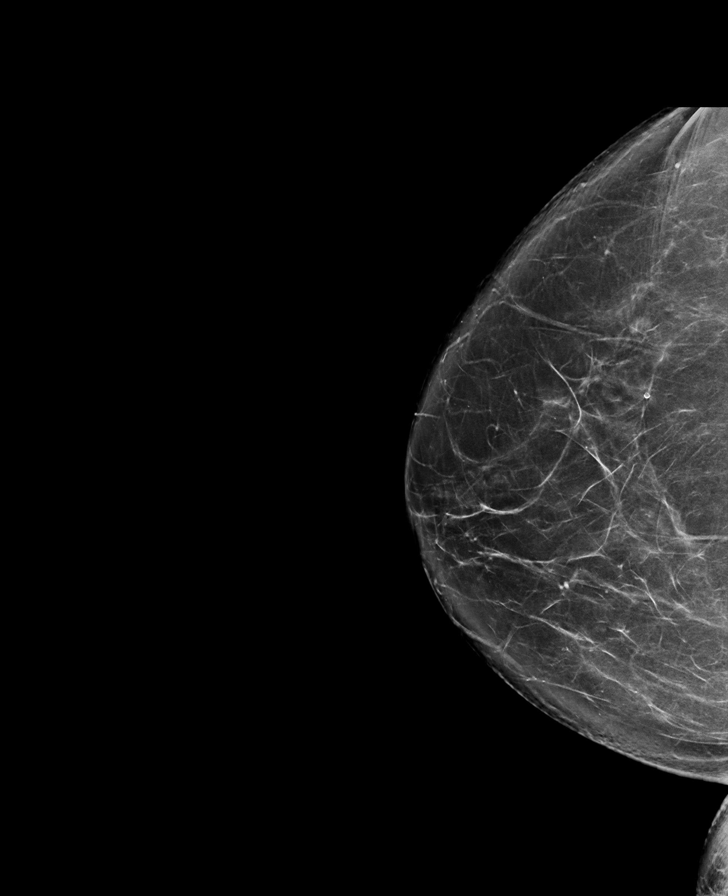

[R MLO synth-2D]
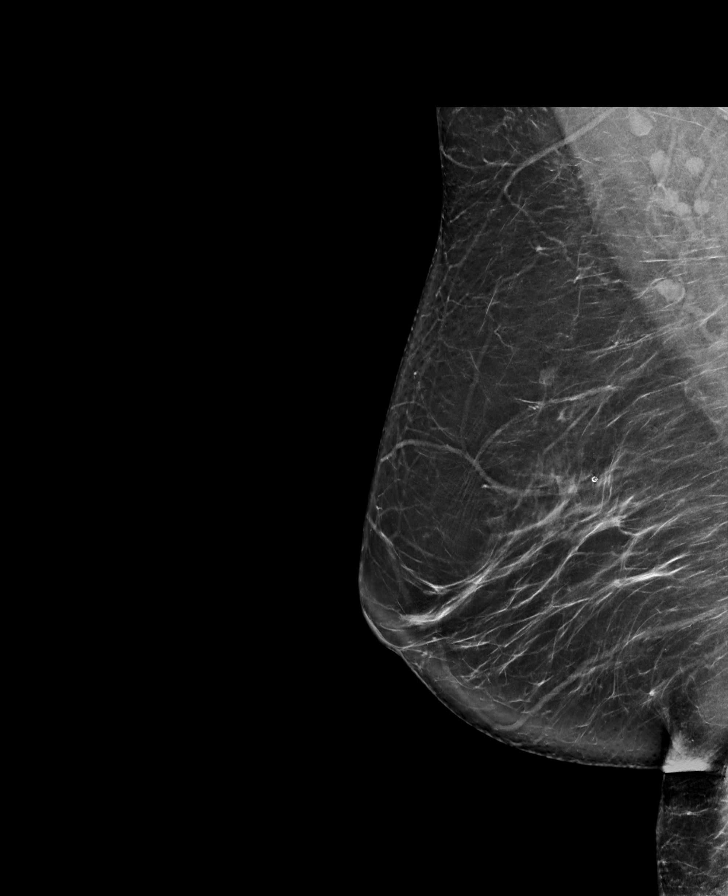

[L MLO synth-2D]
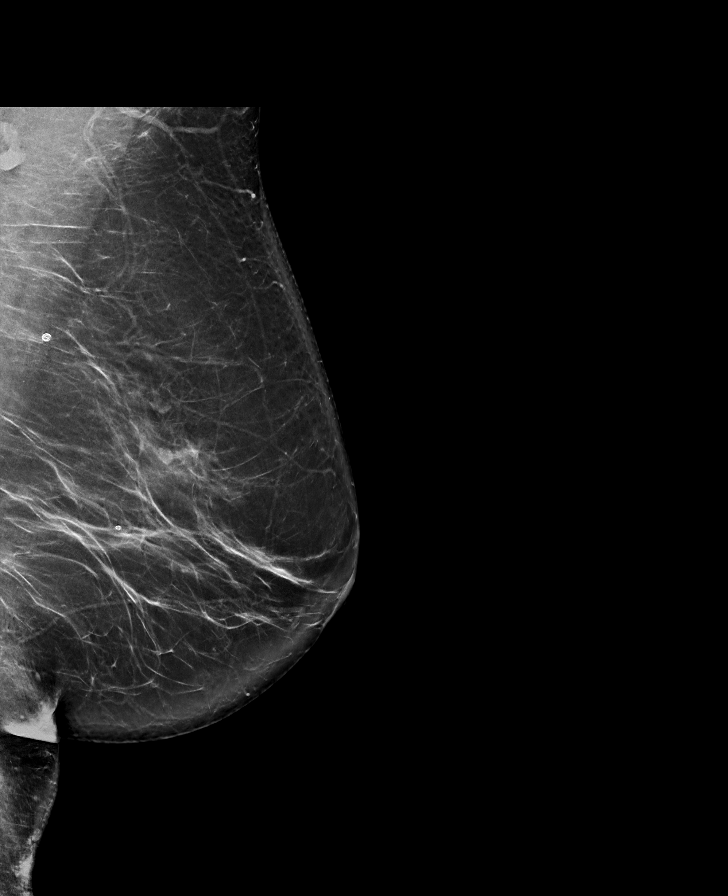

[L CC tomo · tomo slice 39/78.0]
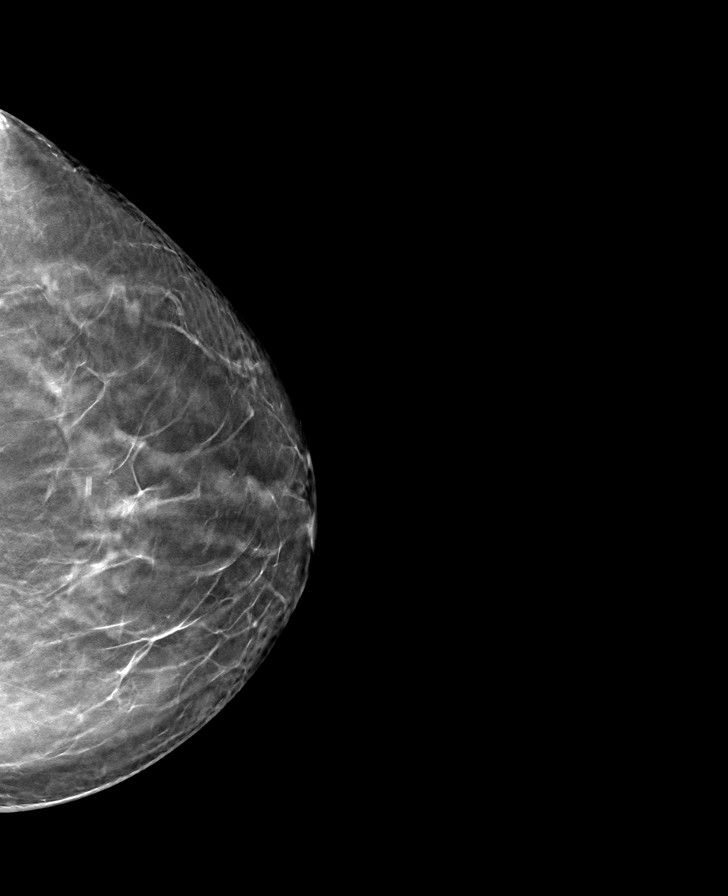

[L MLO tomo · tomo slice 45/90.0]
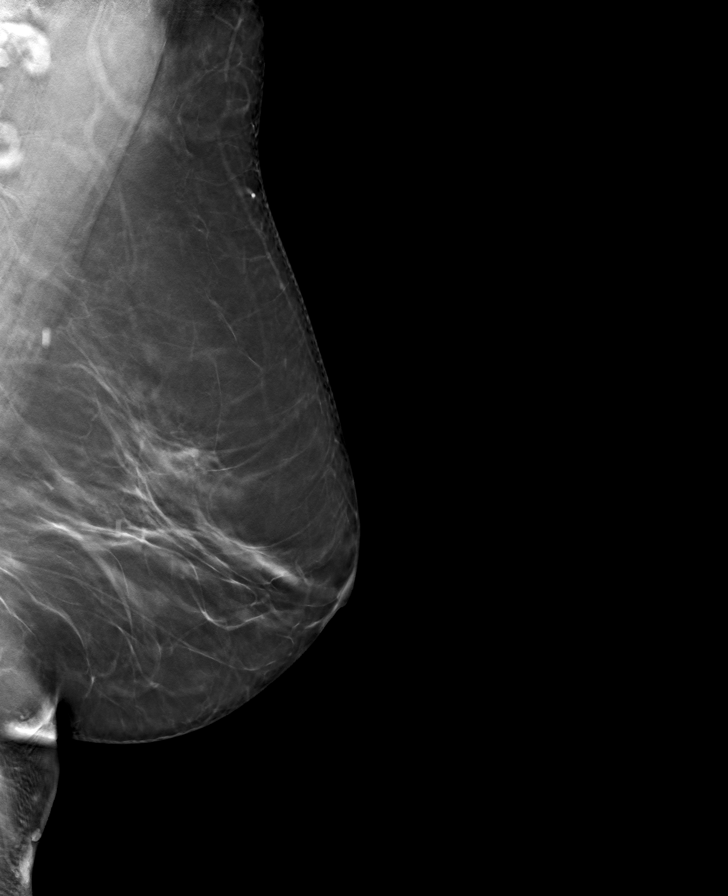

[R MLO tomo · tomo slice 45/88.0]
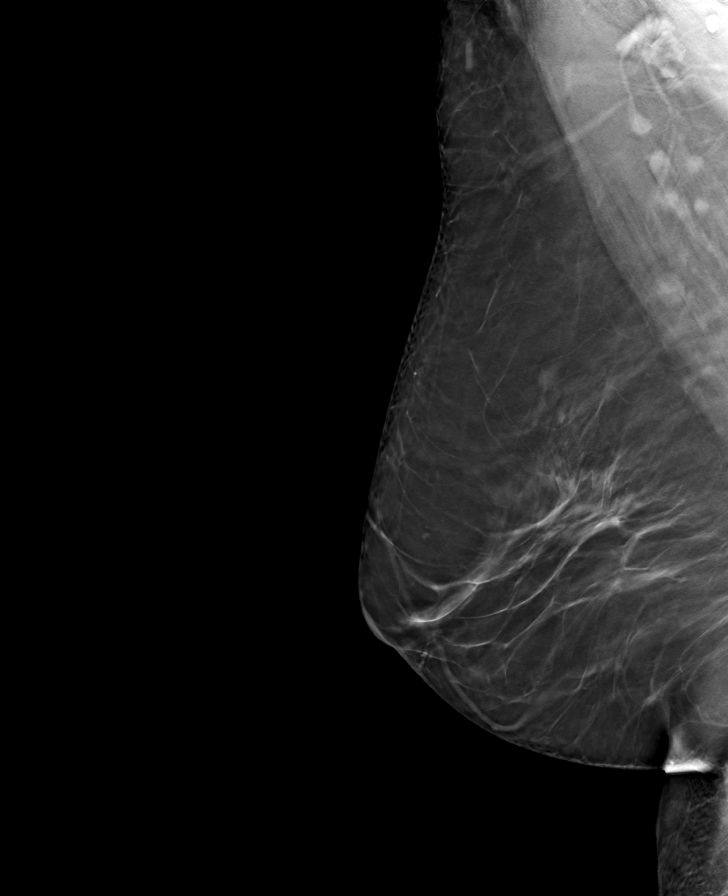

[R CC tomo · tomo slice 41/82.0]
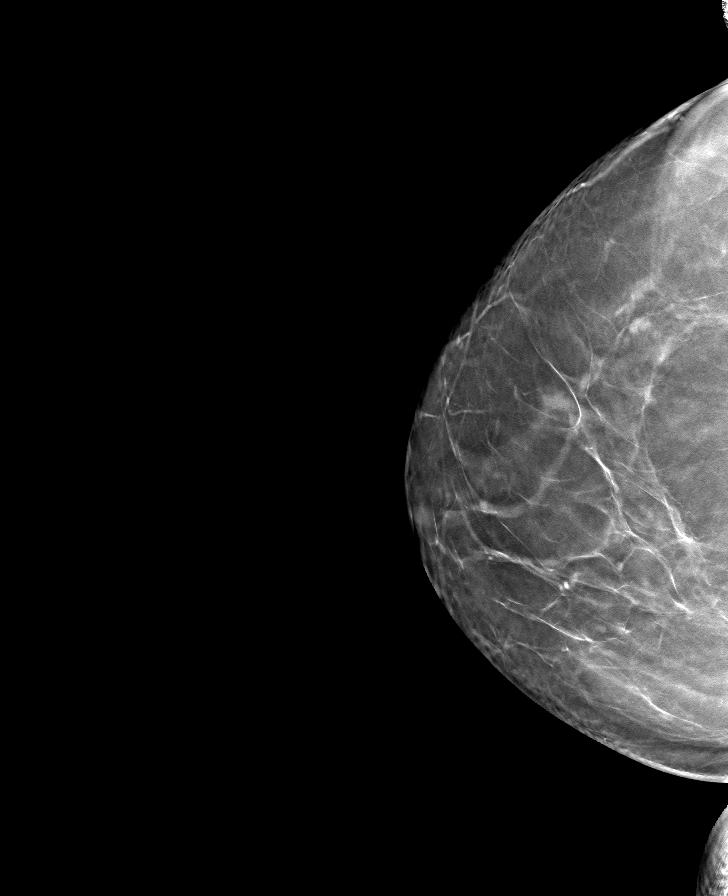

[8 of 24 positions shown; findings below may reference images not displayed]

ACR Breast Density Category b: There are scattered areas of
fibroglandular density.
FINDINGS: There are no findings suspicious for malignancy.
IMPRESSION: No mammographic evidence of malignancy. A result letter of this
screening mammogram will be mailed directly to the patient.

RECOMMENDATION:
Screening mammogram in one year. (Code:51-O-LD2)

BI-RADS CATEGORY  1: Negative.

## 2022-04-21 ENCOUNTER — Other Ambulatory Visit: Payer: Self-pay | Admitting: Family Medicine

## 2022-06-12 ENCOUNTER — Encounter: Payer: Self-pay | Admitting: Medical

## 2022-06-12 ENCOUNTER — Telehealth: Payer: Self-pay | Admitting: Family Medicine

## 2022-06-12 ENCOUNTER — Ambulatory Visit: Payer: Medicare Other | Admitting: Medical

## 2022-06-12 ENCOUNTER — Other Ambulatory Visit: Payer: Self-pay | Admitting: Medical

## 2022-06-12 VITALS — Wt 175.0 lb

## 2022-06-12 DIAGNOSIS — R051 Acute cough: Secondary | ICD-10-CM | POA: Diagnosis not present

## 2022-06-12 DIAGNOSIS — U071 COVID-19: Secondary | ICD-10-CM | POA: Diagnosis not present

## 2022-06-12 DIAGNOSIS — K921 Melena: Secondary | ICD-10-CM

## 2022-06-12 DIAGNOSIS — Z8719 Personal history of other diseases of the digestive system: Secondary | ICD-10-CM

## 2022-06-12 MED ORDER — HYDROCORTISONE ACETATE 25 MG RE SUPP: 25.0000 mg | Freq: Two times a day (BID) | RECTAL | 0 refills | Status: AC

## 2022-06-12 MED ORDER — HYDROCODONE BIT-HOMATROP MBR 5-1.5 MG/5ML PO SOLN: 5.0000 mL | Freq: Three times a day (TID) | ORAL | 0 refills | Status: AC | PRN

## 2022-06-12 MED ORDER — PAXLOVID (300/100) 20 X 150 MG & 10 X 100MG PO TBPK: 1.0000 | ORAL_TABLET | Freq: Two times a day (BID) | ORAL | 0 refills | Status: AC

## 2022-06-12 MED ORDER — NIRMATRELVIR/RITONAVIR (PAXLOVID) TABLET (RENAL DOSING): 2.0000 | ORAL_TABLET | Freq: Two times a day (BID) | ORAL | 0 refills | Status: DC

## 2022-06-12 NOTE — Telephone Encounter (Signed)
[facility] called and wanted to make sure you meant to send in the renal paxlovid instead of regular paxlovid since her GFR is 70, they only recommend it if it is between 30-60 so they just wanted to make sure this is what you wanted to send in for her?

## 2022-06-12 NOTE — Progress Notes (Signed)
Subjective:     Patient ID: Kelli Zavala, female   DOB: 29-Jun-1950, 72 y.o.   MRN: [id number]  This visit type was conducted due to national recommendations for restrictions regarding the COVID-19 Pandemic (e.g. social distancing) in an effort to limit this patient's exposure and mitigate transmission in our community.  Due to their co-morbid illnesses, this patient is at least at moderate risk for complications without adequate follow up.  This format is felt to be most appropriate for this patient at this time.    Documentation for virtual audio and video telecommunications through [place] encounter:  The patient was located at home. The provider was located in the office. The patient did consent to this visit and is aware of possible charges through their insurance for this visit.  The other persons participating in this telemedicine service were none. Time spent on call was 20 minutes and in review of previous records 20 minutes total.  This virtual service is not related to other E/M service within previous 7 days.   HPI Chief Complaint  Patient presents with   tested positive for covid    Symptoms started Saturday evening- cough, nasal drainage, diarrhea yesterday- tested positive yesterday.    Virtual consult for positive covid.  4 days history of ongoing nasal congestion and drainage, cough, some loose stool, fatigue.   No SOB, no wheezing, no nausea or vomiting.  No fever.  Some body aches , but no chills.  No sore throat.   No headaches.  Using some mucinex, nyquil at night, but forgot it last night.   Since Tuesday a week ago had some rectal bleeding.  Bright red blood on toilet paper and in the water.  In the past has had hemorrhoids.  No recent constipation.  No pain at anus or with wiping.  She had not been having bleeding up until this past week.  Had some alcohol last week on average 1 daily when on vacation last week.     No other aggravating or relieving  factors.  No other complaint.  Past Medical History:  Diagnosis Date   Allergic rhinitis, cause unspecified    seasonal   Cataract Eye Dr. advised started   GERD (gastroesophageal reflux disease)    Osteopenia 04/2017   mild, R fem neck   Pure hypercholesterolemia    Current Outpatient Medications on File Prior to Visit  Medication Sig Dispense Refill   atorvastatin (LIPITOR) 10 MG tablet TAKE 1 TABLET(10 MG) BY MOUTH DAILY 90 tablet 3   Calcium Carbonate-Vitamin D (CALCIUM 500 + D PO) Take by mouth.     Coenzyme Q10 (COQ10) 100 MG CAPS Take 1 capsule by mouth.     Esomeprazole Magnesium (NEXIUM 24HR PO) Take 1 capsule by mouth daily.     fexofenadine (ALLEGRA) 180 MG tablet Take 180 mg by mouth daily.     montelukast (SINGULAIR) 10 MG tablet TAKE 1 TABLET(10 MG) BY MOUTH AT BEDTIME 30 tablet 3   Multiple Vitamins-Minerals (WOMENS MULTIVITAMIN PO) Take 1 tablet by mouth daily.     Olopatadine HCl (PATADAY OP) Apply 1 drop to eye daily. 1 drop into each eye daily     vitamin C (ASCORBIC ACID) 500 MG tablet Take 500 mg by mouth daily.     No current facility-administered medications on file prior to visit.    Review of Systems As in subjective    Objective:   Physical Exam Due to coronavirus pandemic stay at home measures, patient visit  was virtual and they were not examined in person.   Wt 175 lb (79.4 kg)   BMI 30.04 kg/m   Gen: wd, wn, nad Congested sounding, no labored breathing or wheezing     Assessment:     Encounter Diagnoses  Name Primary?   COVID Yes   Acute cough    Blood in the stool    History of hemorrhoids        Plan:     We discussed her acute COVID symptoms.  She has used Paxil before and did well with this.  Begin back on Paxlovid.  She can continue Mucinex, rest, hydration.  She can either use NyQuil or the cough syrup below for worse cough.  Discussed risk and benefits of medication, discussed usual timeframe to see resolution of symptoms.  We  discussed quarantine measures.  If not improving over the next week or if new or worse symptoms then recheck.  Regarding blood in the stool-likely some internal hemorrhoids bleeding.  She was on vacation last week and had some alcohol was eating some different things that this could have contributed to some hemorrhoids.  Since she does not have pain and it has not been giving her issue until this past week we will try hot soaks and suppository as below for the next few days.  However advised if this does not clear up if she continues to have bleeding in the next few weeks or next month or 2 then we would certainly need to recheck and consider other evaluations including blood count and possibly referral back to GI.  Her last colonoscopy was 9 years ago.  She did have a CBC blood count that was normal just 2 months ago thankfully reviewing back in the chart record.   Kelli Zavala was seen today for tested positive for covid.  Diagnoses and all orders for this visit:  COVID  Acute cough  Blood in the stool  History of hemorrhoids  Other orders -     nirmatrelvir/ritonavir, renal dosing, (PAXLOVID) 10 x 150 MG & 10 x 100MG  TABS; Take 2 tablets by mouth 2 (two) times daily for 5 days. (Take nirmatrelvir 150 mg one tablet twice daily for 5 days and ritonavir 100 mg one tablet twice daily for 5 days) Patient GFR is 70 -     HYDROcodone bit-homatropine (HYCODAN) 5-1.5 MG/5ML syrup; Take 5 mLs by mouth every 8 (eight) hours as needed for up to 5 days for cough. -     hydrocortisone (ANUSOL-HC) 25 MG suppository; Place 1 suppository (25 mg total) rectally 2 (two) times daily.  F/u prn

## 2022-06-13 ENCOUNTER — Telehealth: Payer: Self-pay | Admitting: Family Medicine

## 2022-06-13 NOTE — Telephone Encounter (Signed)
Pt's husband came is and dropped off flma forms. He states this is for COVID. He states pt is to be called at [phone] when forms are completed.

## 2022-06-14 ENCOUNTER — Ambulatory Visit: Payer: Medicare Other | Admitting: Medical

## 2022-06-26 ENCOUNTER — Telehealth: Payer: Self-pay

## 2022-06-26 NOTE — Telephone Encounter (Signed)
Pt is feeling better. She will come by today to pick form up

## 2022-06-26 NOTE — Telephone Encounter (Signed)
Pt. Called stating you wrote her out of work because she had covid. She is now ready to return to work and she is scheduled tomorrow to go back. She just received a phone call from [employer] that she needs a form filled out to return to work she was going to bring it by later this afternoon. She wanted to know if you could fill it out today. I told her probably not because its already after 2:30 and you have a full schedule. She wanted to know if someone could call her before 5 and let her  know if the form was not going to be filled out today she is going to have to call her job and let them know she is not coming in tomorrow.

## 2022-08-21 ENCOUNTER — Other Ambulatory Visit: Payer: Self-pay | Admitting: Family Medicine

## 2022-09-26 ENCOUNTER — Encounter: Payer: Self-pay | Admitting: Family Medicine

## 2022-10-16 ENCOUNTER — Other Ambulatory Visit: Payer: Medicare Other

## 2022-10-18 ENCOUNTER — Other Ambulatory Visit (INDEPENDENT_AMBULATORY_CARE_PROVIDER_SITE_OTHER): Payer: Medicare Other

## 2022-10-18 DIAGNOSIS — Z23 Encounter for immunization: Secondary | ICD-10-CM | POA: Diagnosis not present

## 2022-11-04 ENCOUNTER — Other Ambulatory Visit: Payer: Self-pay | Admitting: Family Medicine

## 2022-11-13 ENCOUNTER — Ambulatory Visit
Admission: RE | Admit: 2022-11-13 | Discharge: 2022-11-13 | Disposition: A | Discharge status: Home / Self Care | Payer: Medicare Other | Source: Ambulatory Visit | Attending: Family Medicine | Admitting: Family Medicine

## 2022-11-13 DIAGNOSIS — M85851 Other specified disorders of bone density and structure, right thigh: Secondary | ICD-10-CM

## 2022-11-13 DIAGNOSIS — Z1231 Encounter for screening mammogram for malignant neoplasm of breast: Secondary | ICD-10-CM

## 2023-01-09 ENCOUNTER — Encounter: Payer: Self-pay | Admitting: Family Medicine

## 2023-01-09 ENCOUNTER — Ambulatory Visit: Payer: Medicare Other | Admitting: Family Medicine

## 2023-01-09 VITALS — Temp 98.2°F | Wt 163.0 lb

## 2023-01-09 DIAGNOSIS — R051 Acute cough: Secondary | ICD-10-CM

## 2023-01-09 DIAGNOSIS — Z20828 Contact with and (suspected) exposure to other viral communicable diseases: Secondary | ICD-10-CM | POA: Diagnosis not present

## 2023-01-09 DIAGNOSIS — J069 Acute upper respiratory infection, unspecified: Secondary | ICD-10-CM | POA: Diagnosis not present

## 2023-01-09 MED ORDER — BENZONATATE 200 MG PO CAPS
200.0000 mg | ORAL_CAPSULE | Freq: Three times a day (TID) | ORAL | 0 refills | Status: DC | PRN
Start: 2023-01-09 — End: 2023-01-29

## 2023-01-09 NOTE — Patient Instructions (Signed)
Stay well hydrated. Continue pseudoephedrine every 4-6 hours. Start taking Mucinex DM 12 hour TWICE daily. STOP the Nyquil--due to it having the dextromethorphan that is in the Mucinex DM. And also having a decongestant. Consider sinus rinses if you develop any sinus pressure/discomfort (before bed and in the morning).  I sent in a prescription for tessalon perles to use in case the Mucinex DM isn't adequately controlling your cough.  Contact us if you develop a fever, if mucus becomes persistently discolored

## 2023-01-09 NOTE — Progress Notes (Signed)
Start time: 9:59 End time: 10:14  Virtual Visit via Telephone Note  I connected with Kelli Zavala on 01/09/23 by telephone and verified that I am speaking with the correct person using two identifiers. Patient reported inability to do video visit.  Location: Patient: home Provider: office   I discussed the limitations of evaluation and management by telemedicine and the availability of in person appointments. The patient expressed understanding and agreed to proceed.  History of Present Illness:  Chief Complaint  Patient presents with   head congestion    Symptoms started Sunday night- Head congestion, cough, sore throat, legs have ached, no chills or fever. Has been around grandkids last week and yesterday and they were dx with RSV.   Symptoms started 8/22 with a slight cough and sore throat. Her cough is worse, and her head has been congested since Tuesday. Sore throat is better. Cough is occasionally productive, clear.  Nasal drainage is also clear. She states mucus is thick. Denies shortness of breath.  She has been taking Dayquil and Nyquil, but ran out of Dayquil yesterday. Took pseudoephedrine this morning. These helps some.  Acetaminophen, DM, doxylamine, phenylephrine  +sick contacts, grandkids diagnosed with RSV last week, watched them while they were sick (before diagnosed). Had RSV vaccine last year.  PMH, PSH, SH reviewed  Outpatient Encounter Medications as of 01/09/2023  Medication Sig Note   atorvastatin (LIPITOR) 10 MG tablet TAKE 1 TABLET(10 MG) BY MOUTH DAILY    benzonatate (TESSALON) 200 MG capsule Take 1 capsule (200 mg total) by mouth 3 (three) times daily as needed for cough.    Calcium Carbonate-Vitamin D (CALCIUM 500 + D PO) Take by mouth.    Coenzyme Q10 (COQ10) 100 MG CAPS Take 1 capsule by mouth.    Esomeprazole Magnesium (NEXIUM 24HR PO) Take 1 capsule by mouth daily. 07/03/2015: Takes OTC Nexium daily   fexofenadine (ALLEGRA) 180 MG tablet  Take 180 mg by mouth daily.    montelukast (SINGULAIR) 10 MG tablet TAKE 1 TABLET(10 MG) BY MOUTH AT BEDTIME    Multiple Vitamins-Minerals (WOMENS MULTIVITAMIN PO) Take 1 tablet by mouth daily.    Olopatadine HCl (PATADAY OP) Apply 1 drop to eye daily. 1 drop into each eye daily    vitamin C (ASCORBIC ACID) 500 MG tablet Take 500 mg by mouth daily.    hydrocortisone (ANUSOL-HC) 25 MG suppository Place 1 suppository (25 mg total) rectally 2 (two) times daily. (Patient not taking: Reported on 01/09/2023) 01/09/2023: Use as needed   No facility-administered encounter medications on file as of 01/09/2023.   NOT taking tessalon prior to today's visit  Allergies  Allergen Reactions   Pollen Extract Cough and Other (See Comments)   Sunstick Lipstick [Digalloyl Trioleate] Other (See Comments)   ROS:  Denies fever, chills, chest pain, shortness of breath.  No n/v/d, normal appetite. See HPI     Observations/Objective:  Temp 98.2 F (36.8 C)   Wt 163 lb (73.9 kg)   BMI 27.98 kg/m   Sounds a little hoarse and congested. No coughing, sniffling or throat-clearing during visit. She is speaking comfortably, in no distress  Assessment and Plan:  Viral URI - suspect RSV given known exposure. She had vaccine last year and is having mild symptoms. Supportive measures reviewed  RSV exposure  Acute cough - Plan: benzonatate (TESSALON) 200 MG capsule  Stay well hydrated. Continue pseudoephedrine every 4-6 hours. Start taking Mucinex DM 12 hour TWICE daily. STOP the Nyquil--due to it having the  dextromethorphan that is in the Mucinex DM. And also having a decongestant. Consider sinus rinses if you develop any sinus pressure/discomfort (before bed and in the morning).  I sent in a prescription for tessalon perles to use in case the Mucinex DM isn't adequately controlling your cough.  Contact us if you develop a fever, if mucus becomes persistently discolored  Follow Up Instructions:     I discussed the assessment and treatment plan with the patient. The patient was provided an opportunity to ask questions and all were answered. The patient agreed with the plan and demonstrated an understanding of the instructions.   The patient was advised to call back or seek an in-person evaluation if the symptoms worsen or if the condition fails to improve as anticipated.  I spent 18 minutes dedicated to the care of this patient, including pre-visit review of records, face to face time, post-visit ordering of testing and documentation.    Lavonda Jumbo, MD

## 2023-01-18 ENCOUNTER — Emergency Department (HOSPITAL_COMMUNITY)
Admission: EM | Admit: 2023-01-18 | Discharge: 2023-01-18 | Disposition: A | Discharge status: Home / Self Care | Payer: PPO | Attending: Emergency Medicine | Admitting: Emergency Medicine

## 2023-01-18 ENCOUNTER — Encounter (HOSPITAL_COMMUNITY): Payer: Self-pay | Admitting: *Deleted

## 2023-01-18 ENCOUNTER — Other Ambulatory Visit: Payer: Self-pay

## 2023-01-18 DIAGNOSIS — E785 Hyperlipidemia, unspecified: Secondary | ICD-10-CM | POA: Diagnosis not present

## 2023-01-18 DIAGNOSIS — R051 Acute cough: Secondary | ICD-10-CM | POA: Diagnosis not present

## 2023-01-18 DIAGNOSIS — K219 Gastro-esophageal reflux disease without esophagitis: Secondary | ICD-10-CM | POA: Insufficient documentation

## 2023-01-18 DIAGNOSIS — J210 Acute bronchiolitis due to respiratory syncytial virus: Secondary | ICD-10-CM | POA: Diagnosis not present

## 2023-01-18 DIAGNOSIS — Z20822 Contact with and (suspected) exposure to covid-19: Secondary | ICD-10-CM | POA: Insufficient documentation

## 2023-01-18 DIAGNOSIS — R519 Headache, unspecified: Secondary | ICD-10-CM | POA: Diagnosis not present

## 2023-01-18 DIAGNOSIS — R059 Cough, unspecified: Secondary | ICD-10-CM | POA: Diagnosis not present

## 2023-01-18 DIAGNOSIS — Z20828 Contact with and (suspected) exposure to other viral communicable diseases: Secondary | ICD-10-CM | POA: Diagnosis not present

## 2023-01-18 LAB — RESP PANEL BY RT-PCR (RSV, FLU A&B, COVID)  RVPGX2
Influenza A by PCR: NEGATIVE
Influenza B by PCR: NEGATIVE
Resp Syncytial Virus by PCR: POSITIVE — AB
SARS Coronavirus 2 by RT PCR: NEGATIVE

## 2023-01-18 MED ORDER — PREDNISONE 20 MG PO TABS: 40.0000 mg | ORAL_TABLET | Freq: Every day | ORAL | 0 refills | Status: DC

## 2023-01-18 NOTE — ED Triage Notes (Signed)
 Rt sided headache 3 hours ago she is concerned that her sister had a brain tumor   she was diagnosed with rsv on a tv because her granddaughters had rsv

## 2023-01-18 NOTE — Discharge Instructions (Signed)
 Continue to use your home cough and cold medication, wear your mask while you are having active cough to avoid exposing other people to virus.  You can take the course of steroids that I prescribed to help with your cough and congestion.  You can follow-up with your primary care doctor.  If you have significant worsening of your headache despite treatment please return to the emergency department for further evaluation.

## 2023-01-18 NOTE — ED Provider Notes (Signed)
 [place] EMERGENCY DEPARTMENT AT [facility] Provider Note   CSN: [id number] Arrival date & time: 01/18/23  0033     History  Chief Complaint  Patient presents with   rsv    Kelli Zavala is a 73 y.o. female with past medical history significant for GERD, hyperlipidemia who presents with concern for right sided headache that started around 3 hours prior to ED arrival, she has had frequent cough, congestion, recently exposed to granddaughters who were diagnosed with RSV.  She had a televisit and was diagnosed with RSV on her televisit.  Patient reports that symptoms been ongoing for around 10 days.  She denies any yellow-greenish sinus drainage.  She denies any fever, chills.  She has been trying Sudafed and other over-the-counter cough and cold medications.  HPI     Home Medications Prior to Admission medications   Medication Sig Start Date End Date Taking? Authorizing Provider  predniSONE  (DELTASONE ) 20 MG tablet Take 2 tablets (40 mg total) by mouth daily. 01/18/23  Yes Vito Beg H, PA-C  atorvastatin  (LIPITOR) 10 MG tablet TAKE 1 TABLET(10 MG) BY MOUTH DAILY 03/26/22   Randol Dawes, MD  benzonatate  (TESSALON ) 200 MG capsule Take 1 capsule (200 mg total) by mouth 3 (three) times daily as needed for cough. 01/09/23   Randol Dawes, MD  Calcium  Carbonate-Vitamin D  (CALCIUM  500 + D PO) Take by mouth.    [provider]  Coenzyme Q10 (COQ10) 100 MG CAPS Take 1 capsule by mouth.    [provider]  Esomeprazole Magnesium (NEXIUM 24HR PO) Take 1 capsule by mouth daily.    [provider]  fexofenadine (ALLEGRA) 180 MG tablet Take 180 mg by mouth daily.    [provider]  hydrocortisone  (ANUSOL -HC) 25 MG suppository Place 1 suppository (25 mg total) rectally 2 (two) times daily. Patient not taking: Reported on 01/09/2023 06/12/22   Bulah Alm RAMAN, PA-C  montelukast  (SINGULAIR ) 10 MG tablet TAKE 1 TABLET(10 MG) BY MOUTH AT BEDTIME  11/05/22   Randol Dawes, MD  Multiple Vitamins-Minerals (WOMENS MULTIVITAMIN PO) Take 1 tablet by mouth daily.    [provider]  Olopatadine HCl (PATADAY OP) Apply 1 drop to eye daily. 1 drop into each eye daily    [provider]  vitamin C (ASCORBIC ACID) 500 MG tablet Take 500 mg by mouth daily.    [provider]      Allergies    Pollen extract and Sunstick lipstick [digalloyl trioleate]    Review of Systems   Review of Systems  All other systems reviewed and are negative.   Physical Exam Updated Vital Signs BP (!) 156/78 (BP Location: Left Arm)   Pulse 80   Temp 99.5 F (37.5 C) (Oral)   Resp 18   Ht 5' 4 (1.626 m)   Wt 73.9 kg   SpO2 97%   BMI 27.97 kg/m  Physical Exam Vitals and nursing note reviewed.  Constitutional:      General: She is not in acute distress.    Appearance: Normal appearance.  HENT:     Head: Normocephalic and atraumatic.  Eyes:     General:        Right eye: No discharge.        Left eye: No discharge.  Cardiovascular:     Rate and Rhythm: Normal rate and regular rhythm.     Heart sounds: No murmur heard.    No friction rub. No gallop.  Pulmonary:  Effort: Pulmonary effort is normal.     Breath sounds: Normal breath sounds.  Abdominal:     General: Bowel sounds are normal.     Palpations: Abdomen is soft.  Skin:    General: Skin is warm and dry.     Capillary Refill: Capillary refill takes less than 2 seconds.  Neurological:     Mental Status: She is alert and oriented to person, place, and time.     Comments: Cranial nerves II through XII grossly intact.  Intact finger-nose, intact heel-to-shin.  Romberg negative, gait normal.  Alert and oriented x3.  Moves all 4 limbs spontaneously, normal coordination.  No pronator drift.  Intact strength 5 out of 5 bilateral upper and lower extremities.    Psychiatric:        Mood and Affect: Mood normal.        Behavior: Behavior normal.     ED Results /  Procedures / Treatments   Labs (all labs ordered are listed, but only abnormal results are displayed) Labs Reviewed  RESP PANEL BY RT-PCR (RSV, FLU A&B, COVID)  RVPGX2 - Abnormal; Notable for the following components:      Result Value   Resp Syncytial Virus by PCR POSITIVE (*)    All other components within normal limits    EKG None  Radiology No results found.  Procedures Procedures    Medications Ordered in ED Medications - No data to display  ED Course/ Medical Decision Making/ A&P                                 Medical Decision Making  This is a well-appearing 73yo female who presents with concern for 10 days of cough, congestion, headache.  My emergent differential diagnosis includes acute upper respiratory infection with COVID, flu, RSV versus new asthma presentation, acute bronchitis, less clinical concern for pneumonia.  Also considered other ENT emergencies, Ludwig angina, strep pharyngitis, mono, versus epiglottis, tonsillitis versus other.  This is not an exhaustive differential.  On my exam patient is overall well-appearing, they have temperature of 99.5, breathing unlabored, no tachypnea, no respiratory distress, stable oxygen saturation.  Patient without tachycardia.  Mild hypertension, blood pressure 156/78.  RVP independently reviewed by myself shows positive for RSV.  Patient symptoms are consistent with RSV related upper respiratory infection, discussed secondary to the duration of her symptoms, ongoing cough and congestion it would be reasonable to place her on a short course of steroid to help with her symptoms.  Patient understands and agrees to this plan.  We discussed performing head CT to evaluate her headache given her concern for possible brain tumor, pseudoaneurysm, versus other.  But discussed that she has no focal neurologic deficits they think that this may be unnecessary at this time.  Patient agrees with plan to try treating for acute bronchitis, have  PCP follow up, and return precautions given. encouraged ibuprofen, Tylenol, rest, plenty of fluids.  Discussed extensive return precautions.  Patient discharged in stable condition at this time.  Final Clinical Impression(s) / ED Diagnoses Final diagnoses:  RSV (acute bronchiolitis due to respiratory syncytial virus)  Acute cough    Rx / DC Orders ED Discharge Orders          Ordered    predniSONE  (DELTASONE ) 20 MG tablet  Daily        01/18/23 0748  Rosan Sherlean DEL, PA-C 01/18/23 0757    Patsey Lot, MD 01/18/23 1459

## 2023-01-18 NOTE — ED Notes (Signed)
 Pt did not answer name call for registration

## 2023-01-20 ENCOUNTER — Telehealth: Payer: Self-pay | Admitting: Family Medicine

## 2023-01-20 NOTE — Telephone Encounter (Signed)
 Pt informed

## 2023-01-20 NOTE — Telephone Encounter (Signed)
 You dx pt with RSV 1 1/2 weeks ago.  She got sicker and went to ER due.  She also has bronchitis.  They gave her Prednisone  and cough Syrup and told her to take Ibuprofen.  Told her to stop Sudafed and Mucinex .  She states she is so stopped up she cannot breathe.  Can she start back taking Sudafed or Mucinex ?  [phone]

## 2023-01-20 NOTE — Telephone Encounter (Signed)
 Yes, she can take mucinex. I encourage her to do sinus rinses as well. Her blood pressure was elevated in the ER. She can use sudafed if monitoring her BP, and ensures it stays <140/90.

## 2023-01-24 ENCOUNTER — Encounter: Payer: Self-pay | Admitting: Family Medicine

## 2023-01-29 ENCOUNTER — Encounter: Payer: Self-pay | Admitting: Family Medicine

## 2023-01-29 ENCOUNTER — Ambulatory Visit: Payer: PPO | Admitting: Family Medicine

## 2023-01-29 VITALS — BP 138/84 | HR 92 | Temp 98.2°F | Ht 64.0 in | Wt 162.0 lb

## 2023-01-29 DIAGNOSIS — J0100 Acute maxillary sinusitis, unspecified: Secondary | ICD-10-CM

## 2023-01-29 DIAGNOSIS — R051 Acute cough: Secondary | ICD-10-CM | POA: Diagnosis not present

## 2023-01-29 MED ORDER — AMOXICILLIN-POT CLAVULANATE 875-125 MG PO TABS: 1.0000 | ORAL_TABLET | Freq: Two times a day (BID) | ORAL | 0 refills | Status: DC

## 2023-01-29 MED ORDER — BENZONATATE 200 MG PO CAPS: 200.0000 mg | ORAL_CAPSULE | Freq: Three times a day (TID) | ORAL | 0 refills | Status: DC | PRN

## 2023-01-29 NOTE — Patient Instructions (Signed)
  Continue to stay well hydrated, use the Mucinex  DM as needed, and sudafed as your blood pressure tolerates (if needed for sinus pain). Continue the sinus rinses. Take the antibiotics to treat the sinus infection. Use the tessalon  perles if needed for cough (hopefully just until the antibiotics start helping). Contact us  if your symptoms don't resolve.  Typically it can took a good 5-7 days to start noticing improvement.  If you develop diarrhea, you can try some imodium and probiotics.

## 2023-01-29 NOTE — Progress Notes (Signed)
 Chief Complaint  Patient presents with   Facial Pain    Had RSV at Christmas. Then brochitis after, given prednisone . Has continued to have sinus drainage that has been clear. Since the weekend it has changed to a yellow/green color. Has been using Neti Pot since last Wed.    Symptoms started 12/22, she was seen for virtual visit 12/26.  Diagnosed with viral URI, suspected RSV given known exposures.  She had ER visit on 1/4 where RSV was confirmed.  She was treated with a course of prednisone  for acute bronchitis and OTC cough syrup.  She called 2 days later stating that ER told her to stop Sudafed and Mucinex . She called because she was so stopped up she couldn't breathe. She was advised to restart mucinex .  Her BP was elevated in ER, so could restart sudafed if monitoring BP and not high, and sinus rinses were also encouraged.  She sent the following message on 01/24/23: "Now the mucus has moved to my head/sinuses. Some of the time I can't take the Sudafed because my BP has been too high. This includes this morning. I plan to take it again at 8 to check whether it's acceptable to take the Sudafed. I'm taking Mucinex  DM, Tylenol, the prescrip ran its course and I've been doing the sinus rinse."  She felt better with the prednisone , was feeling good on Sunday 1/12, but she noticed her mucus changed from clear-white to yellow-green.  It has remained discolored.  She has some intermittent sinus pain (frontal), and sometimes in the L cheek. She is getting more purulent drainage from the left side than the R.  She has been using sudafed (when BP allows--couldn't use it yesterday at all, took it this morning). Mucinex  DM, Tylenol prn for HA's (when has sinus HA but can't take sudafed).   PMH, PSH, SH reviewed  Outpatient Encounter Medications as of 01/29/2023  Medication Sig Note   atorvastatin  (LIPITOR) 10 MG tablet TAKE 1 TABLET(10 MG) BY MOUTH DAILY    Calcium  Carbonate-Vitamin D  (CALCIUM  500 + D  PO) Take by mouth.    Coenzyme Q10 (COQ10) 100 MG CAPS Take 1 capsule by mouth.    dextromethorphan-guaiFENesin  (MUCINEX  DM) 30-600 MG 12hr tablet Take 1 tablet by mouth 2 (two) times daily. 01/29/2023: Took at 7am   Esomeprazole Magnesium (NEXIUM 24HR PO) Take 1 capsule by mouth daily. 07/03/2015: Takes OTC Nexium daily   fexofenadine (ALLEGRA) 180 MG tablet Take 180 mg by mouth daily.    montelukast  (SINGULAIR ) 10 MG tablet TAKE 1 TABLET(10 MG) BY MOUTH AT BEDTIME    Multiple Vitamins-Minerals (WOMENS MULTIVITAMIN PO) Take 1 tablet by mouth daily.    Olopatadine HCl (PATADAY OP) Apply 1 drop to eye daily. 1 drop into each eye daily    pseudoephedrine (SUDAFED) 120 MG 12 hr tablet Take 120 mg by mouth 2 (two) times daily. 01/29/2023: Took at 7am   vitamin C (ASCORBIC ACID) 500 MG tablet Take 500 mg by mouth daily.    benzonatate  (TESSALON ) 200 MG capsule Take 1 capsule (200 mg total) by mouth 3 (three) times daily as needed for cough. (Patient not taking: Reported on 01/29/2023) 01/29/2023: finished   hydrocortisone  (ANUSOL -HC) 25 MG suppository Place 1 suppository (25 mg total) rectally 2 (two) times daily. (Patient not taking: Reported on 01/29/2023) 01/29/2023: As needed   [DISCONTINUED] predniSONE  (DELTASONE ) 20 MG tablet Take 2 tablets (40 mg total) by mouth daily.    No facility-administered encounter medications on file as  of 01/29/2023.   Allergies  Allergen Reactions   Pollen Extract Cough and Other (See Comments)   Sunstick Lipstick [Digalloyl Trioleate] Other (See Comments)    ROS: no fever/chills. No n/v/d. Denies shortness of breath, wheezing. No chest pain, no body aches. No bleeding, bruising, rash.    PHYSICAL EXAM:  BP 138/84   Pulse 92   Temp 98.2 F (36.8 C) (Tympanic)   Ht 5\' 4"  (1.626 m)   Wt 162 lb (73.5 kg)   BMI 27.81 kg/m   Wt Readings from Last 3 Encounters:  01/29/23 162 lb (73.5 kg)  01/18/23 162 lb 14.7 oz (73.9 kg)  01/09/23 163 lb (73.9 kg)    Pleasant female, sounds congested, and intermittent cough during visit. She is in no distress, speaking easily and comfortably. HEENT: PERRL, EOMI, conjunctiva and sclera are clear. TMs and EACs normal bilaterally Nasal mucosa with mild-mod edema, no erythema or purulence. Mildly tender at L maxillary sinus OP is clear without erythema or purulent drainage noted Neck: no lymphadenopathy or mass Heart: regular rate and rhythm, no murmur Lungs: clear bilaterally, no wheezes, rales, ronchi Skin: normal turgor, no rash Psych: normal mood, affect, hygiene and grooming Neuro: alert and oriented, cranial nerves grossly intact.    ASSESSMENT/PLAN:  Acute non-recurrent maxillary sinusitis - treat with Augmentin . Cont sinus rinses, mucinex , and sudafed when BP tolerates it - Plan: amoxicillin -clavulanate (AUGMENTIN ) 875-125 MG tablet  Acute cough - Plan: benzonatate  (TESSALON ) 200 MG capsule  I spent 30 minutes dedicated to the care of this patient, including pre-visit review of records, face to face time, post-visit ordering of testing and documentation.   Continue to stay well hydrated, use the Mucinex  DM as needed, and sudafed as your blood pressure tolerates (if needed for sinus pain). Continue the sinus rinses. Take the antibiotics to treat the sinus infection. Use the tessalon  perles if needed for cough (hopefully just until the antibiotics start helping). Contact us  if your symptoms don't resolve.  Typically it can took a good 5-7 days to start noticing improvement.  If you develop diarrhea, you can try some imodium and probiotics.

## 2023-02-13 ENCOUNTER — Other Ambulatory Visit: Payer: Self-pay | Admitting: Family Medicine

## 2023-02-13 ENCOUNTER — Encounter: Payer: Self-pay | Admitting: Family Medicine

## 2023-02-14 MED ORDER — MONTELUKAST SODIUM 10 MG PO TABS
10.0000 mg | ORAL_TABLET | Freq: Every day | ORAL | 0 refills | Status: DC
Start: 2023-02-14 — End: 2023-03-28

## 2023-02-18 ENCOUNTER — Emergency Department (HOSPITAL_BASED_OUTPATIENT_CLINIC_OR_DEPARTMENT_OTHER): Payer: PPO

## 2023-02-18 ENCOUNTER — Other Ambulatory Visit: Payer: Self-pay

## 2023-02-18 ENCOUNTER — Encounter (HOSPITAL_BASED_OUTPATIENT_CLINIC_OR_DEPARTMENT_OTHER): Payer: Self-pay | Admitting: Emergency Medicine

## 2023-02-18 ENCOUNTER — Emergency Department (HOSPITAL_BASED_OUTPATIENT_CLINIC_OR_DEPARTMENT_OTHER)
Admission: EM | Admit: 2023-02-18 | Discharge: 2023-02-19 | Disposition: A | Discharge status: Home / Self Care | Payer: PPO | Attending: Emergency Medicine | Admitting: Emergency Medicine

## 2023-02-18 DIAGNOSIS — D2222 Melanocytic nevi of left ear and external auricular canal: Secondary | ICD-10-CM | POA: Diagnosis not present

## 2023-02-18 DIAGNOSIS — D2272 Melanocytic nevi of left lower limb, including hip: Secondary | ICD-10-CM | POA: Diagnosis not present

## 2023-02-18 DIAGNOSIS — R109 Unspecified abdominal pain: Secondary | ICD-10-CM | POA: Insufficient documentation

## 2023-02-18 DIAGNOSIS — D2271 Melanocytic nevi of right lower limb, including hip: Secondary | ICD-10-CM | POA: Diagnosis not present

## 2023-02-18 DIAGNOSIS — Z86018 Personal history of other benign neoplasm: Secondary | ICD-10-CM | POA: Diagnosis not present

## 2023-02-18 DIAGNOSIS — L430 Hypertrophic lichen planus: Secondary | ICD-10-CM | POA: Diagnosis not present

## 2023-02-18 DIAGNOSIS — D225 Melanocytic nevi of trunk: Secondary | ICD-10-CM | POA: Diagnosis not present

## 2023-02-18 DIAGNOSIS — L821 Other seborrheic keratosis: Secondary | ICD-10-CM | POA: Diagnosis not present

## 2023-02-18 DIAGNOSIS — D1722 Benign lipomatous neoplasm of skin and subcutaneous tissue of left arm: Secondary | ICD-10-CM | POA: Diagnosis not present

## 2023-02-18 DIAGNOSIS — L578 Other skin changes due to chronic exposure to nonionizing radiation: Secondary | ICD-10-CM | POA: Diagnosis not present

## 2023-02-18 DIAGNOSIS — R110 Nausea: Secondary | ICD-10-CM | POA: Insufficient documentation

## 2023-02-18 DIAGNOSIS — D485 Neoplasm of uncertain behavior of skin: Secondary | ICD-10-CM | POA: Diagnosis not present

## 2023-02-18 LAB — URINALYSIS, ROUTINE W REFLEX MICROSCOPIC
Bilirubin Urine: NEGATIVE
Glucose, UA: NEGATIVE mg/dL
Hgb urine dipstick: NEGATIVE
Ketones, ur: NEGATIVE mg/dL
Leukocytes,Ua: NEGATIVE
Nitrite: NEGATIVE
Protein, ur: NEGATIVE mg/dL
Specific Gravity, Urine: 1.022 (ref 1.005–1.030)
pH: 6.5 (ref 5.0–8.0)

## 2023-02-18 LAB — CBC
HCT: 40.8 % (ref 36.0–46.0)
Hemoglobin: 13.7 g/dL (ref 12.0–15.0)
MCH: 30.6 pg (ref 26.0–34.0)
MCHC: 33.6 g/dL (ref 30.0–36.0)
MCV: 91.1 fL (ref 80.0–100.0)
Platelets: 208 10*3/uL (ref 150–400)
RBC: 4.48 MIL/uL (ref 3.87–5.11)
RDW: 12.7 % (ref 11.5–15.5)
WBC: 5.8 10*3/uL (ref 4.0–10.5)
nRBC: 0 % (ref 0.0–0.2)

## 2023-02-18 LAB — COMPREHENSIVE METABOLIC PANEL
ALT: 17 U/L (ref 0–44)
AST: 17 U/L (ref 15–41)
Albumin: 4.3 g/dL (ref 3.5–5.0)
Alkaline Phosphatase: 97 U/L (ref 38–126)
Anion gap: 6 (ref 5–15)
BUN: 15 mg/dL (ref 8–23)
CO2: 29 mmol/L (ref 22–32)
Calcium: 9.5 mg/dL (ref 8.9–10.3)
Chloride: 103 mmol/L (ref 98–111)
Creatinine, Ser: 0.81 mg/dL (ref 0.44–1.00)
GFR, Estimated: >60 mL/min (ref >60)
Glucose, Bld: 87 mg/dL (ref 70–99)
Potassium: 4.2 mmol/L (ref 3.5–5.1)
Sodium: 138 mmol/L (ref 135–145)
Total Bilirubin: 0.8 mg/dL (ref 0.0–1.2)
Total Protein: 7.3 g/dL (ref 6.5–8.1)

## 2023-02-18 LAB — LIPASE, BLOOD: Lipase: 94 U/L — ABNORMAL HIGH (ref 11–51)

## 2023-02-18 MED ORDER — IOHEXOL 300 MG/ML  SOLN
100.0000 mL | Freq: Once | INTRAMUSCULAR | Status: AC | PRN
  Administered 2023-02-18: 85 mL via INTRAVENOUS

## 2023-02-18 NOTE — ED Triage Notes (Signed)
Right flank pain since this AM +N/-V/-D. No HX abd surg. Afebrile. Denies urinary symptoms.

## 2023-02-19 ENCOUNTER — Encounter: Payer: Self-pay | Admitting: *Deleted

## 2023-02-19 ENCOUNTER — Ambulatory Visit: Payer: PPO | Admitting: Family Medicine

## 2023-02-19 MED ORDER — DICLOFENAC SODIUM 1 % EX GEL: 4.0000 g | Freq: Four times a day (QID) | CUTANEOUS | 0 refills | Status: AC

## 2023-02-19 MED ORDER — ONDANSETRON 4 MG PO TBDP: ORAL_TABLET | ORAL | 0 refills | Status: DC

## 2023-02-19 NOTE — Discharge Instructions (Signed)
Use the gel as prescribed Also take tylenol 1017m(2 extra strength) four times a day.

## 2023-02-19 NOTE — ED Provider Notes (Signed)
 [place] EMERGENCY DEPARTMENT AT [facility] Provider Note   CSN: [id number] Arrival date & time: 02/18/23  1615     History  Chief Complaint  Patient presents with   Flank Pain    Kelli Zavala is a 73 y.o. female.  73 yo F with a chief complaints of right side pain.  This been going on for about a week.  Developed some nausea this morning.  No vomiting no diarrhea.  Nothing seems to make the pain better or worse.  Has been able to eat and drink without issue.  Denies rash.   Flank Pain       Home Medications Prior to Admission medications   Medication Sig Start Date End Date Taking? Authorizing Provider  diclofenac  Sodium (VOLTAREN ) 1 % GEL Apply 4 g topically 4 (four) times daily. 02/19/23  Yes Emil Share, DO  ondansetron  (ZOFRAN -ODT) 4 MG disintegrating tablet 4mg  ODT q4 hours prn nausea/vomit 02/19/23  Yes Torey Reinard, DO  amoxicillin -clavulanate (AUGMENTIN ) 875-125 MG tablet Take 1 tablet by mouth 2 (two) times daily. 01/29/23   Randol Dawes, MD  atorvastatin  (LIPITOR) 10 MG tablet TAKE 1 TABLET(10 MG) BY MOUTH DAILY 03/26/22   Randol Dawes, MD  benzonatate  (TESSALON ) 200 MG capsule Take 1 capsule (200 mg total) by mouth 3 (three) times daily as needed for cough. 01/29/23   Randol Dawes, MD  Calcium  Carbonate-Vitamin D  (CALCIUM  500 + D PO) Take by mouth.    [provider]  Coenzyme Q10 (COQ10) 100 MG CAPS Take 1 capsule by mouth.    [provider]  dextromethorphan-guaiFENesin  (MUCINEX  DM) 30-600 MG 12hr tablet Take 1 tablet by mouth 2 (two) times daily.    [provider]  Esomeprazole Magnesium (NEXIUM 24HR PO) Take 1 capsule by mouth daily.    [provider]  fexofenadine (ALLEGRA) 180 MG tablet Take 180 mg by mouth daily.    [provider]  hydrocortisone  (ANUSOL -HC) 25 MG suppository Place 1 suppository (25 mg total) rectally 2 (two) times daily. Patient not taking: Reported on 01/29/2023 06/12/22   Tysinger, Alm RAMAN,  PA-C  montelukast  (SINGULAIR ) 10 MG tablet Take 1 tablet (10 mg total) by mouth at bedtime. 02/14/23   Randol Dawes, MD  Multiple Vitamins-Minerals (WOMENS MULTIVITAMIN PO) Take 1 tablet by mouth daily.    [provider]  Olopatadine HCl (PATADAY OP) Apply 1 drop to eye daily. 1 drop into each eye daily    [provider]  pseudoephedrine (SUDAFED) 120 MG 12 hr tablet Take 120 mg by mouth 2 (two) times daily.    [provider]  vitamin C (ASCORBIC ACID) 500 MG tablet Take 500 mg by mouth daily.    [provider]      Allergies    Pollen extract and Sunstick lipstick [digalloyl trioleate]    Review of Systems   Review of Systems  Genitourinary:  Positive for flank pain.    Physical Exam Updated Vital Signs BP (!) 155/81   Pulse 60   Temp 98.7 F (37.1 C)   Resp 16   SpO2 98%  Physical Exam Vitals and nursing note reviewed.  Constitutional:      General: She is not in acute distress.    Appearance: She is well-developed. She is not diaphoretic.  HENT:     Head: Normocephalic and atraumatic.  Eyes:     Pupils: Pupils are equal, round, and reactive to light.  Cardiovascular:     Rate and Rhythm:  Normal rate and regular rhythm.     Heart sounds: No murmur heard.    No friction rub. No gallop.  Pulmonary:     Effort: Pulmonary effort is normal.     Breath sounds: No wheezing or rales.  Abdominal:     General: There is no distension.     Palpations: Abdomen is soft.     Tenderness: There is no abdominal tenderness.  Musculoskeletal:        General: No tenderness.     Cervical back: Normal range of motion and neck supple.     Comments: She points over her right sided ribs along the anterior axillary line about ribs 6 through 10.  There is no obvious rash.  No obvious tenderness.  No pain with deep palpation of the right upper quadrant.  Negative Murphy sign.  Skin:    General: Skin is warm and dry.  Neurological:     Mental Status: She is  alert and oriented to person, place, and time.  Psychiatric:        Behavior: Behavior normal.     ED Results / Procedures / Treatments   Labs (all labs ordered are listed, but only abnormal results are displayed) Labs Reviewed  LIPASE, BLOOD - Abnormal; Notable for the following components:      Result Value   Lipase 94 (*)    All other components within normal limits  COMPREHENSIVE METABOLIC PANEL  CBC  URINALYSIS, ROUTINE W REFLEX MICROSCOPIC    EKG None  Radiology CT ABDOMEN PELVIS W CONTRAST Result Date: 02/18/2023 CLINICAL DATA:  Abdominal pain EXAM: CT ABDOMEN AND PELVIS WITH CONTRAST TECHNIQUE: Multidetector CT imaging of the abdomen and pelvis was performed using the standard protocol following bolus administration of intravenous contrast. RADIATION DOSE REDUCTION: This exam was performed according to the departmental dose-optimization program which includes automated exposure control, adjustment of the mA and/or kV according to patient size and/or use of iterative reconstruction technique. CONTRAST:  85mL OMNIPAQUE  IOHEXOL  300 MG/ML  SOLN COMPARISON:  None Available. FINDINGS: Lower Chest: Normal. Hepatobiliary: Normal hepatic contours. No intra- or extrahepatic biliary dilatation. The gallbladder is normal. Pancreas: Normal pancreas. No ductal dilatation or peripancreatic fluid collection. Spleen: Normal. Adrenals/Urinary Tract: The adrenal glands are normal. No hydronephrosis, nephroureterolithiasis or solid renal mass. The urinary bladder is normal for degree of distention Stomach/Bowel: There is no hiatal hernia. Normal duodenal course and caliber. No small bowel dilatation or inflammation. No focal colonic abnormality. Normal appendix. Vascular/Lymphatic: Normal course and caliber of the major abdominal vessels. No abdominal or pelvic lymphadenopathy. Reproductive: Normal uterus. No adnexal mass. Other: None. Musculoskeletal: Grade 1 anterolisthesis at L4-5 IMPRESSION: 1. No  acute abnormality of the abdomen or pelvis. 2. Grade 1 anterolisthesis at L4-5. Electronically Signed   By: Franky Stanford M.D.   On: 02/18/2023 21:45    Procedures Procedures    Medications Ordered in ED Medications  iohexol  (OMNIPAQUE ) 300 MG/ML solution 100 mL (85 mLs Intravenous Contrast Given 02/18/23 2126)    ED Course/ Medical Decision Making/ A&P                                 Medical Decision Making Amount and/or Complexity of Data Reviewed Labs: ordered.  Risk Prescription drug management.   73 yo F with right sided pain.  Patient thought it was musculoskeletal and then developed some nausea this morning.  Came in for evaluation.  No  abdominal discomfort.  Has been able to eat and drink without issue.  Has had some nausea.  Will treat as possible musculoskeletal.  PCP follow-up.  Patient had lab work and a CT ordered to the [facility] process.  No leukocytosis, no anemia, no significant electrolyte abnormalities.  LFTs unremarkable.  Lipase is in indeterminate range.  UA negative for infection on my independent to rotation.  CT imaging without obvious acute finding.  1:12 AM:  I have discussed the diagnosis/risks/treatment options with the patient.  Evaluation and diagnostic testing in the emergency department does not suggest an emergent condition requiring admission or immediate intervention beyond what has been performed at this time.  They will follow up with PCP. We also discussed returning to the ED immediately if new or worsening sx occur. We discussed the sx which are most concerning (e.g., sudden worsening pain, fever, inability to tolerate by mouth) that necessitate immediate return. Medications administered to the patient during their visit and any new prescriptions provided to the patient are listed below.  Medications given during this visit Medications  iohexol  (OMNIPAQUE ) 300 MG/ML solution 100 mL (85 mLs Intravenous Contrast Given 02/18/23 2126)     The patient  appears reasonably screen and/or stabilized for discharge and I doubt any other medical condition or other [facility] requiring further screening, evaluation, or treatment in the ED at this time prior to discharge.          Final Clinical Impression(s) / ED Diagnoses Final diagnoses:  Right flank pain    Rx / DC Orders ED Discharge Orders          Ordered    diclofenac  Sodium (VOLTAREN ) 1 % GEL  4 times daily        02/19/23 0041    ondansetron  (ZOFRAN -ODT) 4 MG disintegrating tablet        02/19/23 0041              Emil Share, DO 02/19/23 0112

## 2023-02-21 ENCOUNTER — Encounter: Payer: Self-pay | Admitting: Family Medicine

## 2023-03-10 DIAGNOSIS — E663 Overweight: Secondary | ICD-10-CM | POA: Diagnosis not present

## 2023-03-10 DIAGNOSIS — K219 Gastro-esophageal reflux disease without esophagitis: Secondary | ICD-10-CM | POA: Diagnosis not present

## 2023-03-10 DIAGNOSIS — J309 Allergic rhinitis, unspecified: Secondary | ICD-10-CM | POA: Diagnosis not present

## 2023-03-10 DIAGNOSIS — E785 Hyperlipidemia, unspecified: Secondary | ICD-10-CM | POA: Diagnosis not present

## 2023-03-18 DIAGNOSIS — G4733 Obstructive sleep apnea (adult) (pediatric): Secondary | ICD-10-CM | POA: Diagnosis not present

## 2023-03-28 NOTE — Patient Instructions (Addendum)
 HEALTH MAINTENANCE RECOMMENDATIONS:  It is recommended that you get at least 30 minutes of aerobic exercise at least 5 days/week (for weight loss, you may need as much as 60-90 minutes). This can be any activity that gets your heart rate up. This can be divided in 10-15 minute intervals if needed, but try and build up your endurance at least once a week.  Weight bearing exercise is also recommended twice weekly.  Eat a healthy diet with lots of vegetables, fruits and fiber.  "Colorful" foods have a lot of vitamins (ie green vegetables, tomatoes, red peppers, etc).  Limit sweet tea, regular sodas and alcoholic beverages, all of which has a lot of calories and sugar.  Up to 1 alcoholic drink daily may be beneficial for women (unless trying to lose weight, watch sugars).  Drink a lot of water.  Calcium recommendations are 1200-1500 mg daily (1500 mg for postmenopausal women or women without ovaries), and vitamin D 1000 IU daily.  This should be obtained from diet and/or supplements (vitamins), and calcium should not be taken all at once, but in divided doses.  Monthly self breast exams and yearly mammograms for women over the age of 27 is recommended.  Sunscreen of at least SPF 30 should be used on all sun-exposed parts of the skin when outside between the hours of 10 am and 4 pm (not just when at beach or pool, but even with exercise, golf, tennis, and yard work!)  Use a sunscreen that says "broad spectrum" so it covers both UVA and UVB rays, and make sure to reapply every 1-2 hours.  Remember to change the batteries in your smoke detectors when changing your clock times in the spring and fall. Carbon monoxide detectors are recommended for your home.  Use your seat belt every time you are in a car, and please drive safely and not be distracted with cell phones and texting while driving.   Kelli Zavala , Thank you for taking time to come for your Medicare Wellness Visit. I appreciate your ongoing  commitment to your health goals. Please review the following plan we discussed and let me know if I can assist you in the future.   This is a list of the screening recommended for you and due dates:  Health Maintenance  Topic Date Due   Flu Shot  08/15/2022   COVID-19 Vaccine (8 - 2024-25 season) 04/18/2023   Colon Cancer Screening  10/05/2023   Mammogram  11/13/2023   Medicare Annual Wellness Visit  03/30/2024   DTaP/Tdap/Td vaccine (3 - Td or Tdap) 04/08/2032   Pneumonia Vaccine  Completed   DEXA scan (bone density measurement)  Completed   Hepatitis C Screening  Completed   Zoster (Shingles) Vaccine  Completed   HPV Vaccine  Aged Out   We discussed that you are due for colon cancer screening in September, options including colonoscopy and stool testing. We referred you to [facility] for colonoscopy.  Try and limit fried foods. This will help your overall health, and lower triglycerides. You can also consider taking omega-3 fish oil (3000 mg daily) if your triglycerides remain elevated.  We discussed trial of famotidine rather than Nexium daily, to see if it is equally effective. You can try using this regularly (up to twice daily).  If doing well, you can consider using either medication just as needed, prior to triggering foods, vs daily.  Use Flonase if your allergies aren't well controlled by the montelukast and Allegra alone.  Be sure to use it daily until the end of the season, for best effect.

## 2023-03-28 NOTE — Progress Notes (Unsigned)
 Chief Complaint  Patient presents with   Medicare Wellness    Fasting AWV/CPE no pelvic as she is not having any issues. Would like to have colonoscopy, [place] is fine. She is good for Prevnar 20. No new concerns.    Kelli Zavala is a 73 y.o. female who presents for annual physical exam, Medicare wellness visit and follow-up on chronic medical conditions.    She was seen in ER 02/2023 with R flank pain. Labs and CT fairly unremarkable. Noted to have mildly elevated lipase.  Treated as MSK pain (advised voltaren gel, rx'd zofran prn). She never filled the zofran (wasn't covered), nausea was better the next day. She hasn't had any recurrent pain.  Lab Results  Component Value Date   LIPASE 94 (H) 02/18/2023   CT scan: IMPRESSION: 1. No acute abnormality of the abdomen or pelvis. 2. Grade 1 anterolisthesis at L4-5.  Sleep apnea:  Severe OSA was diagnosed on 03/2018 sleep study. She is using her CPAP machine nightly.  She feels refreshed when she wakes up, most of the time.  She still wakes up sometimes, but can get back to sleep. She gets up to go to the bathroom around 3am about half the time, and doesn't put the CPAP back on when she gets back to bed.    Hyperlipidemia follow-up:  Patient is reportedly following a low-fat, low cholesterol diet. She is compliant with taking atorvastatin. In the past she noted some myalgias, which resolved when she started taking Coenzyme Q10. She continues to take this, and denies side effects. She denies any changes to her diet.  She is due for recheck. TG was elevated last year. She reports today having fried foods about 2x/week. Doesn't eat much sweets/junk.  Has a piece of candy after taking her medications at night. Doesn't take any fish oil.  Lab Results  Component Value Date   CHOL 166 03/25/2022   HDL 46 03/25/2022   LDLCALC 81 03/25/2022   TRIG 234 (H) 03/25/2022   CHOLHDL 3.6 03/25/2022   GERD: She no longer sees specialist for her  reflux. She continues to do well on OTC Nexium daily.  Symptom was cough, not heartburn. Denies dysphagia.  Only rarely has a cough. Last year discussed trial of famotidine rather than Nexium daily, to see if equally effective, and also discussed prn use prior to triggering foods, vs daily (for either medication). She admits she never tried changing this.  Allergies: She takes allegra and montelukast daily.  She hasn't needed flonase (doesn't particularly like it).   Immunization History  Administered Date(s) Administered   Fluad Quad(high Dose 65+) 09/28/2019   Influenza, High Dose Seasonal PF 10/13/2016, 09/06/2018, 09/26/2020, 09/25/2021, 10/18/2022   Influenza-Unspecified 10/14/2012, 10/14/2013, 10/05/2015, 09/30/2017   Moderna Covid-19 Vaccine Bivalent Booster 25yrs & up 10/24/2020   Moderna Sars-Covid-2 Vaccination 01/27/2019, 02/25/2019, 11/16/2019   Pfizer(Comirnaty)Fall Seasonal Vaccine 12 years and older 11/06/2021, 03/25/2022, 10/18/2022   Pneumococcal Conjugate-13 02/17/2017   Pneumococcal Polysaccharide-23 02/18/2018   RSV IGIV 09/25/2021   Tdap 10/09/2011, 04/09/2022   Zoster Recombinant(Shingrix) 07/09/2016, 11/14/2016   Zoster, Live 10/09/2011   Last Pap smear: 02/2017 normal, no high risk HPV Last mammogram: 10/2022 Last colonoscopy: 09/2013, Dr. Kinnie Scales.  10 year f/u rec Last DEXA: 10/2022 T-1.4 R fem neck (prior was T-1.3 in 04/2017, same location) Dentist: twice a year Ophtho: yearly Exercise:  Walks with granddaughter 2 days/week (weather-permitting).  Carries 24# granddaughter. Yoga 1-2xweek at the [facility]. Walks the track for 1 mile  once a week. Walks around holding the baby (granddaughter) frequently. Weight-bearing exercise with caring for granddaughter.  Calcium--has milk daily, plus Ca and MVI, and some yogurt.  Normal vitamin D-OH (47) in 06/2015. Started Women's MVI daily since then   Patient Care Team: Joselyn Arrow, MD as PCP - General (Family  Medicine) Mottinger, Alba Cory, DMD (Dentistry) Gelene Mink, OD as Referring Physician (Optometry) Elmon Else, MD as Consulting Physician (Dermatology) Jodi Geralds, MD as Consulting Physician (Orthopedic Surgery) GYN: Dr. Tenny Craw (retired) GI: Dr. Kinnie Scales (retired)   Depression Screening: Flowsheet Row Office Visit from 03/31/2023 in [facility]  PHQ-2 Total Score 0        Falls screen:     03/31/2023    8:27 AM 03/25/2022    8:22 AM 03/19/2021    8:19 AM 03/06/2020    8:37 AM 02/22/2019    9:41 AM  Fall Risk   Falls in the past year? 0 0 1 0 1  Number falls in past yr: 0 0 1  0  Comment   2 falls. Tripped over baby gate, and another where she caught herself with her hands.  today, this morning when she went to get breakfast @ [employer]  Injury with Fall? 0 0 0  1  Comment   no injuries  brusing to left elbow  Risk for fall due to : No Fall Risks No Fall Risks History of fall(s)    Follow up Falls evaluation completed Falls evaluation completed Falls evaluation completed       Functional Status Survey: Is the patient deaf or have difficulty hearing?: No Does the patient have difficulty seeing, even when wearing glasses/contacts?: No Does the patient have difficulty concentrating, remembering, or making decisions?: No Does the patient have difficulty walking or climbing stairs?: No Does the patient have difficulty dressing or bathing?: No Does the patient have difficulty doing errands alone such as visiting a doctor's office or shopping?: No  Mini-Cog Scoring: 5    End of Life Discussion:  Patient has a living will and medical power of attorney, in chart.   PMH, PSH, SH and FH reviewed and updated  Outpatient Encounter Medications as of 03/31/2023  Medication Sig Note   atorvastatin (LIPITOR) 10 MG tablet TAKE 1 TABLET(10 MG) BY MOUTH DAILY    Calcium Carbonate-Vitamin D (CALCIUM 500 + D PO) Take by mouth.    Coenzyme Q10 (COQ10) 100 MG CAPS Take 1 capsule  by mouth.    diclofenac Sodium (VOLTAREN) 1 % GEL Apply 4 g topically 4 (four) times daily. 03/31/2023: Using on L thumb and ring finger   Esomeprazole Magnesium (NEXIUM 24HR PO) Take 1 capsule by mouth daily. 07/03/2015: Takes OTC Nexium daily   fexofenadine (ALLEGRA) 180 MG tablet Take 180 mg by mouth daily.    montelukast (SINGULAIR) 10 MG tablet Take 1 tablet (10 mg total) by mouth at bedtime.    Multiple Vitamins-Minerals (WOMENS MULTIVITAMIN PO) Take 1 tablet by mouth daily.    Olopatadine HCl (PATADAY OP) Apply 1 drop to eye daily. 1 drop into each eye daily    vitamin C (ASCORBIC ACID) 500 MG tablet Take 500 mg by mouth daily.    hydrocortisone (ANUSOL-HC) 25 MG suppository Place 1 suppository (25 mg total) rectally 2 (two) times daily. (Patient not taking: Reported on 01/09/2023) 03/31/2023: As needed   [DISCONTINUED] amoxicillin-clavulanate (AUGMENTIN) 875-125 MG tablet Take 1 tablet by mouth 2 (two) times daily.    [DISCONTINUED] benzonatate (TESSALON) 200 MG  capsule Take 1 capsule (200 mg total) by mouth 3 (three) times daily as needed for cough.    [DISCONTINUED] dextromethorphan-guaiFENesin (MUCINEX DM) 30-600 MG 12hr tablet Take 1 tablet by mouth 2 (two) times daily. 01/29/2023: Took at 7am   [DISCONTINUED] ondansetron (ZOFRAN-ODT) 4 MG disintegrating tablet 4mg  ODT q4 hours prn nausea/vomit (Patient not taking: Reported on 03/31/2023)    [DISCONTINUED] pseudoephedrine (SUDAFED) 120 MG 12 hr tablet Take 120 mg by mouth 2 (two) times daily. 01/29/2023: Took at 7am   No facility-administered encounter medications on file as of 03/31/2023.   Allergies  Allergen Reactions   Pollen Extract Cough and Other (See Comments)   Sunstick Lipstick [Digalloyl Trioleate] Other (See Comments)    ROS:  The patient denies anorexia, fever, headaches, decreased hearing, ear pain, sore throat, chest pain, palpitations, dizziness, syncope, dyspnea on exertion, swelling, nausea, vomiting, diarrhea,  constipation, abdominal pain, melena, hematochezia, indigestion/heartburn, urinary complaints, vaginal bleeding, discharge, odor, itch or genital lesions. Denies joint pains, numbness, tingling, weakness, tremor, suspicious skin lesions, depression, anxiety, abnormal bleeding/bruising, or enlarged lymph nodes. Allergies--controlled currently.  L hand pain (thumb and ring finger), no other joint pains. No ophthalmic migraines in a few years. Had HA's when she had RSV. Sees Dr. Emily Filbert yearly for routine skin checks.  +intentional weight loss    PHYSICAL EXAM:  BP 128/78   Pulse 80   Ht 5' 3.75" (1.619 m)   Wt 158 lb 9.6 oz (71.9 kg)   BMI 27.44 kg/m   Wt Readings from Last 3 Encounters:  03/31/23 158 lb 9.6 oz (71.9 kg)  01/29/23 162 lb (73.5 kg)  01/18/23 162 lb 14.7 oz (73.9 kg)   03/25/22 175 lb 9.6 oz (79.7 kg)  04/13/21 168 lb (76.2 kg)  04/09/21 168 lb (76.2 kg)    General Appearance:    Alert, cooperative, no distress, appears stated age.   Head:    Normocephalic, without obvious abnormality, atraumatic  Eyes:    PERRL, conjunctiva/corneas clear, EOM's intact, fundi benign  Ears:    Normal TM's and external ear canals.  Nose:   No drainage or sinus tenderness  Throat:   Normal mucosa  Neck:   Supple, no lymphadenopathy;  thyroid:  no enlargement/tenderness/ nodules; no carotid bruit or JVD  Back:    Spine nontender, no curvature, ROM normal, no CVA tenderness  Lungs:     Clear to auscultation bilaterally without wheezes, rales or ronchi; respirations unlabored  Chest Wall:    No tenderness or deformity   Heart:    Regular rate and rhythm, S1 and S2 normal, no murmur, rub or gallop.   Breast Exam:    No tenderness, masses, or nipple discharge or inversion.  No axillary lymphadenopathy  Abdomen:     Soft, non-tender, nondistended, normoactive bowel sounds, no masses, no hepatosplenomegaly  Genitalia:    Not performed  Rectal:    Not performed  Extremities:   No clubbing,  cyanosis or edema. Discoloration of bilateral great toenails, not thick.  Pulses:   2+ and symmetric all extremities  Skin:   Skin color, texture, turgor normal, no rashes or lesions.   Lymph nodes:   Cervical, supraclavicular, inguinal and axillary nodes normal  Neurologic:   Normal strength, sensation and gait; reflexes 2+ and symmetric throughout                              Psych:   Normal mood, affect,  hygiene and grooming.     ASSESSMENT/PLAN:  Annual physical exam  Medicare annual wellness visit, subsequent  Obstructive sleep apnea on CPAP - compliant with CPAP, continues to receive benefit from ongoing use  Pure hypercholesterolemia - continue lowfat, low cholesterol diet, atorvastatin. To cut back on fried foods. Consider omega-3 fish oil if TG elevated - Plan: Lipid panel, atorvastatin (LIPITOR) 10 MG tablet  Osteopenia of neck of right femur - mild; discussed Ca (diet/supplements), D, weight-bearing exercise. Rec recheck DEXA 5 years  Seasonal allergies - cont montelukast, oral antihistamine eye drops. To restart nasal steroid if sx not well controlled with this regimen - Plan: montelukast (SINGULAIR) 10 MG tablet  Gastroesophageal reflux disease without esophagitis - manifest as cough. Reviewed proper diet, risks of PPI. Rec trial of H2 blocker. Discussed daily vs prn (prior to triggering meal)  Elevated lipase - unclear why; no symptoms (having flank pain at that time). Recheck today. - Plan: Lipase  Medication monitoring encounter - Plan: Lipid panel, CMP14+EGFR, CBC with Differential/Platelet  Need for pneumococcal 20-valent conjugate vaccination - Plan: Pneumococcal conjugate vaccine 20-valent (Prevnar 20)  Screening for colon cancer - refer to [facility] for next colonoscopy, as Dr. Kinnie Scales retired - Plan: Ambulatory referral to Gastroenterology   Discussed monthly self breast exams and yearly mammograms; at least 30 minutes of aerobic activity at least 5 days/week and  weight-bearing exercise 2x/week; proper sunscreen use reviewed; healthy diet, including goals of calcium and vitamin D intake and alcohol recommendations (less than or equal to 1 drink/day) reviewed; regular seatbelt use; changing batteries in smoke detectors.  Immunization recommendations discussed--continue yearly high dose flu shots. Prevnar-20 given. Discussed recommendation for another COVID booster in April (q6 mos). Colon cancer screening is due in 09/2023. Discussed colonoscopy and Cologuard. Referred to [facility].  Repeat DEXA in 5 years. Pap smears are no longer needed.  MOST form reviewed and updated.  Full Code, Full Care  F/u 1 year, sooner prn.   Medicare Attestation I have personally reviewed: The patient's medical and social history Their use of alcohol, tobacco or illicit drugs Their current medications and supplements The patient's functional ability including ADLs,fall risks, home safety risks, cognitive, and hearing and visual impairment Diet and physical activities Evidence for depression or mood disorders  The patient's weight, height, BMI have been recorded in the chart.  I have made referrals, counseling, and provided education to the patient based on review of the above and I have provided the patient with a written personalized care plan for preventive services.

## 2023-03-31 ENCOUNTER — Encounter: Payer: Self-pay | Admitting: Family Medicine

## 2023-03-31 ENCOUNTER — Ambulatory Visit: Payer: Medicare Other | Admitting: Family Medicine

## 2023-03-31 VITALS — BP 128/78 | HR 80 | Ht 63.75 in | Wt 158.6 lb

## 2023-03-31 DIAGNOSIS — K219 Gastro-esophageal reflux disease without esophagitis: Secondary | ICD-10-CM | POA: Diagnosis not present

## 2023-03-31 DIAGNOSIS — Z1211 Encounter for screening for malignant neoplasm of colon: Secondary | ICD-10-CM

## 2023-03-31 DIAGNOSIS — Z0000 Encounter for general adult medical examination without abnormal findings: Secondary | ICD-10-CM

## 2023-03-31 DIAGNOSIS — Z5181 Encounter for therapeutic drug level monitoring: Secondary | ICD-10-CM | POA: Diagnosis not present

## 2023-03-31 DIAGNOSIS — G4733 Obstructive sleep apnea (adult) (pediatric): Secondary | ICD-10-CM

## 2023-03-31 DIAGNOSIS — E7800 Pure hypercholesterolemia, unspecified: Secondary | ICD-10-CM | POA: Diagnosis not present

## 2023-03-31 DIAGNOSIS — R748 Abnormal levels of other serum enzymes: Secondary | ICD-10-CM | POA: Diagnosis not present

## 2023-03-31 DIAGNOSIS — Z23 Encounter for immunization: Secondary | ICD-10-CM

## 2023-03-31 DIAGNOSIS — M85851 Other specified disorders of bone density and structure, right thigh: Secondary | ICD-10-CM | POA: Diagnosis not present

## 2023-03-31 DIAGNOSIS — J302 Other seasonal allergic rhinitis: Secondary | ICD-10-CM

## 2023-03-31 LAB — LIPID PANEL

## 2023-03-31 MED ORDER — MONTELUKAST SODIUM 10 MG PO TABS: 10.0000 mg | ORAL_TABLET | Freq: Every day | ORAL | 3 refills | Status: AC

## 2023-03-31 MED ORDER — ATORVASTATIN CALCIUM 10 MG PO TABS: ORAL_TABLET | ORAL | 3 refills | Status: AC

## 2023-04-01 ENCOUNTER — Encounter: Payer: Self-pay | Admitting: Family Medicine

## 2023-04-01 LAB — CBC WITH DIFFERENTIAL/PLATELET
Basophils Absolute: 0 10*3/uL (ref 0.0–0.2)
Basos: 1 %
EOS (ABSOLUTE): 0.1 10*3/uL (ref 0.0–0.4)
Eos: 3 %
Hematocrit: 44.6 % (ref 34.0–46.6)
Hemoglobin: 15 g/dL (ref 11.1–15.9)
Immature Grans (Abs): 0 10*3/uL (ref 0.0–0.1)
Immature Granulocytes: 0 %
Lymphocytes Absolute: 1.5 10*3/uL (ref 0.7–3.1)
Lymphs: 35 %
MCH: 31.3 pg (ref 26.6–33.0)
MCHC: 33.6 g/dL (ref 31.5–35.7)
MCV: 93 fL (ref 79–97)
Monocytes Absolute: 0.3 10*3/uL (ref 0.1–0.9)
Monocytes: 6 %
Neutrophils Absolute: 2.4 10*3/uL (ref 1.4–7.0)
Neutrophils: 55 %
Platelets: 192 10*3/uL (ref 150–450)
RBC: 4.79 x10E6/uL (ref 3.77–5.28)
RDW: 12.7 % (ref 11.7–15.4)
WBC: 4.3 10*3/uL (ref 3.4–10.8)

## 2023-04-01 LAB — LIPID PANEL
Cholesterol, Total: 149 mg/dL (ref 100–199)
HDL: 55 mg/dL (ref >39)
LDL CALC COMMENT:: 2.7 ratio (ref 0.0–4.4)
LDL Chol Calc (NIH): 77 mg/dL (ref 0–99)
Triglycerides: 91 mg/dL (ref 0–149)
VLDL Cholesterol Cal: 17 mg/dL (ref 5–40)

## 2023-04-01 LAB — CMP14+EGFR
ALT: 15 IU/L (ref 0–32)
AST: 16 IU/L (ref 0–40)
Albumin: 4.7 g/dL (ref 3.8–4.8)
Alkaline Phosphatase: 110 IU/L (ref 44–121)
BUN/Creatinine Ratio: 12 (ref 12–28)
BUN: 11 mg/dL (ref 8–27)
Bilirubin Total: 0.8 mg/dL (ref 0.0–1.2)
CO2: 24 mmol/L (ref 20–29)
Calcium: 9.4 mg/dL (ref 8.7–10.3)
Chloride: 102 mmol/L (ref 96–106)
Creatinine, Ser: 0.9 mg/dL (ref 0.57–1.00)
Globulin, Total: 2.3 g/dL (ref 1.5–4.5)
Glucose: 82 mg/dL (ref 70–99)
Potassium: 4 mmol/L (ref 3.5–5.2)
Sodium: 143 mmol/L (ref 134–144)
Total Protein: 7 g/dL (ref 6.0–8.5)
eGFR: 68 mL/min/{1.73_m2} (ref >59)

## 2023-04-01 LAB — LIPASE: Lipase: 62 U/L (ref 14–85)

## 2023-04-16 ENCOUNTER — Encounter: Payer: Self-pay | Admitting: Internal Medicine

## 2023-04-17 DIAGNOSIS — G4733 Obstructive sleep apnea (adult) (pediatric): Secondary | ICD-10-CM | POA: Diagnosis not present

## 2023-05-06 ENCOUNTER — Encounter: Payer: Self-pay | Admitting: Family Medicine

## 2023-05-06 NOTE — Telephone Encounter (Signed)
 Per CPE, recommended another booster in April (rec is q6 mos for >65, for those who choose to do so). She was likely trying to schedule a nurse visit for COVID booster.  She should be advised that we don't have any in the office, and should get it from the pharmacy

## 2023-06-03 ENCOUNTER — Encounter: Payer: Self-pay | Admitting: Internal Medicine

## 2023-06-03 ENCOUNTER — Ambulatory Visit (AMBULATORY_SURGERY_CENTER)

## 2023-06-03 VITALS — Ht 64.0 in | Wt 158.0 lb

## 2023-06-03 DIAGNOSIS — Z1211 Encounter for screening for malignant neoplasm of colon: Secondary | ICD-10-CM

## 2023-06-03 DIAGNOSIS — G4733 Obstructive sleep apnea (adult) (pediatric): Secondary | ICD-10-CM | POA: Diagnosis not present

## 2023-06-03 MED ORDER — NA SULFATE-K SULFATE-MG SULF 17.5-3.13-1.6 GM/177ML PO SOLN: 1.0000 | Freq: Once | ORAL | 0 refills | Status: AC

## 2023-06-03 NOTE — Progress Notes (Signed)
 Pre visit completed via phone call; Patient verified name, DOB, and address; No egg or soy allergy known to patient;  No issues known to pt with past sedation with any surgeries or procedures; Patient denies ever being told they had issues or difficulty with intubation; No FH of Malignant Hyperthermia; Pt is not on diet pills; Pt is not on home 02;  Pt is not on blood thinners;  Pt denies issues with constipation;  No A fib or A flutter; Have any cardiac testing pending--NO Insurance verified during PV appt--- HTA Medicare Pt can ambulate without assistance;  Pt denies use of chewing tobacco; Discussed diabetic/weight loss medication holds; Discussed NSAID holds; Checked BMI to be less than 50; Pt instructed to use Single[employer]com or GoodRx for a price reduction on prep; Patient's chart reviewed by Rogena Class CNRA prior to previsit and patient appropriate for the [facility]; Pre visit completed and red dot placed by patient's name on their procedure day (on provider's schedule);  Instructions sent to patient via MyChart per patient request;

## 2023-06-10 ENCOUNTER — Other Ambulatory Visit: Payer: Self-pay | Admitting: Medical Genetics

## 2023-06-15 NOTE — Progress Notes (Unsigned)
 [facility] History and Physical   Primary Care Physician:  Roosvelt Colla, MD   Reason for Procedure:  Colon cancer screening  Plan:    Colonoscopy     HPI: Kelli Zavala is a 73 y.o. female here for a repeat screening colonoscopy.  In 2015 colonoscopy performed by Dr. Andriette Keeling was reported to be normal.   Past Medical History:  Diagnosis Date   Allergic rhinitis, cause unspecified    seasonal   Cataract Eye Dr. advised started   GERD (gastroesophageal reflux disease)    Osteopenia 04/2017   bilateral knees   Pure hypercholesterolemia    Sleep apnea    uses CPAP    Past Surgical History:  Procedure Laterality Date   COLONOSCOPY  2015   Dr. Andriette Keeling -normal- prep unknown    Prior to Admission medications   Medication Sig Start Date End Date Taking? Authorizing Provider  atorvastatin  (LIPITOR) 10 MG tablet TAKE 1 TABLET(10 MG) BY MOUTH DAILY 03/31/23   Roosvelt Colla, MD  Calcium  Carbonate-Vitamin D  (CALCIUM  500 + D PO) Take by mouth.    [provider]  Coenzyme Q10 (COQ10) 100 MG CAPS Take 1 capsule by mouth.    [provider]  diclofenac  Sodium (VOLTAREN ) 1 % GEL Apply 4 g topically 4 (four) times daily. 02/19/23   Floyd, Dan, DO  Esomeprazole Magnesium (NEXIUM 24HR PO) Take 1 capsule by mouth daily.    [provider]  fexofenadine (ALLEGRA) 180 MG tablet Take 180 mg by mouth daily.    [provider]  hydrocortisone  (ANUSOL -HC) 25 MG suppository Place 1 suppository (25 mg total) rectally 2 (two) times daily. 06/12/22   Tysinger, Christiane Cowing, PA-C  montelukast  (SINGULAIR ) 10 MG tablet Take 1 tablet (10 mg total) by mouth at bedtime. 03/31/23   Roosvelt Colla, MD  Multiple Vitamins-Minerals (WOMENS MULTIVITAMIN PO) Take 1 tablet by mouth daily.    [provider]  Olopatadine HCl (PATADAY OP) Apply 1 drop to eye daily. 1 drop into each eye daily    [provider]  vitamin C (ASCORBIC ACID) 500 MG tablet Take 500 mg by  mouth daily.    [provider]    Current Outpatient Medications  Medication Sig Dispense Refill   atorvastatin  (LIPITOR) 10 MG tablet TAKE 1 TABLET(10 MG) BY MOUTH DAILY 90 tablet 3   Calcium  Carbonate-Vitamin D  (CALCIUM  500 + D PO) Take by mouth.     Coenzyme Q10 (COQ10) 100 MG CAPS Take 1 capsule by mouth.     diclofenac  Sodium (VOLTAREN ) 1 % GEL Apply 4 g topically 4 (four) times daily. 100 g 0   Esomeprazole Magnesium (NEXIUM 24HR PO) Take 1 capsule by mouth daily.     fexofenadine (ALLEGRA) 180 MG tablet Take 180 mg by mouth daily.     hydrocortisone  (ANUSOL -HC) 25 MG suppository Place 1 suppository (25 mg total) rectally 2 (two) times daily. 12 suppository 0   montelukast  (SINGULAIR ) 10 MG tablet Take 1 tablet (10 mg total) by mouth at bedtime. 90 tablet 3   Multiple Vitamins-Minerals (WOMENS MULTIVITAMIN PO) Take 1 tablet by mouth daily.     Olopatadine HCl (PATADAY OP) Apply 1 drop to eye daily. 1 drop into each eye daily     vitamin C (ASCORBIC ACID) 500 MG tablet Take 500 mg by mouth daily.     No current facility-administered medications for this visit.    Allergies as of 06/16/2023 - Review Complete 06/03/2023  Allergen Reaction Noted  Pollen extract Cough and Other (See Comments) 06/14/1981   Sunstick lipstick [digalloyl trioleate] Other (See Comments) 01/15/1964    Family History  Problem Relation Age of Onset   Hyperlipidemia Mother    Hypothyroidism Mother    Parkinsonism Mother    Colon polyps Mother    Heart disease Mother 49       CABG x 2   Dementia Mother        mild   Osteoporosis Mother    Breast cancer Mother 67   Depression Mother    Anxiety disorder Mother    Arthritis Mother    Kidney disease Mother    Miscarriages / Stillbirths Mother    Heart disease Father    Depression Father    Alcohol abuse Father    Hypothyroidism Sister    Migraines Sister    Cerebral aneurysm Sister    Colon polyps Sister    Migraines Sister     Hypothyroidism Sister    Allergies Brother    Colon polyps Brother    Cancer Maternal Grandmother        in her back (bone?)   Heart disease Maternal Grandfather    Heart disease Paternal Grandmother    Diabetes Paternal Grandmother    Heart disease Paternal Grandfather    ADD / ADHD Son    Learning disabilities Son    Hernia Son        umbilicar hernia, repaired 2x   Kidney Stones Son    Migraines Son    Colon cancer Neg Hx    Esophageal cancer Neg Hx    Stomach cancer Neg Hx    Rectal cancer Neg Hx     Social History   Socioeconomic History   Marital status: Married    Spouse name: Not on file   Number of children: Not on file   Years of education: Not on file   Highest education level: Not on file  Occupational History   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  1 cat   Mother moved in with her October 29, 2015, she passed away in Jun 28, 2020.    2 sons locally ([place] and [employer]), 3 granddaughters; watching the baby (born 11/2021) 2 days/week.   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     Husband is a smoker, but he doesn't smoke in car or in house.      Updated 03/2023   Social Drivers of Health   Financial Resource Strain: Low Risk  (03/31/2023)   Overall Financial Resource Strain (CARDIA)    Difficulty of Paying  Living Expenses: Not hard at all  Food Insecurity: No Food Insecurity (03/31/2023)   Hunger Vital Sign    Worried About Running Out  of Food in the Last Year: Never true    Ran Out of Food in the Last Year: Never true  Transportation Needs: No Transportation Needs (03/31/2023)   PRAPARE - Administrator, Civil Service (Medical): No    Lack of Transportation (Non-Medical): No  Physical Activity: Sufficiently Active (03/31/2023)   Exercise Vital Sign    Days of Exercise per Week: 2 days    Minutes of Exercise per Session: 120 min  Stress: No Stress Concern Present (03/31/2023)   [employer] of Occupational Health - Occupational Stress Questionnaire    Feeling of Stress : Not at all  Social Connections: Socially Integrated (03/31/2023)   Social Connection and Isolation Panel [NHANES]    Frequency of Communication with Friends and Family: Twice a week    Frequency of Social Gatherings with Friends and Family: More than three times a week    Attends Religious Services: More than 4 times per year    Active Member of [employer] or Organizations: Yes    Attends Engineer, structural: More than 4 times per year    Marital Status: Married  Catering manager Violence: Not At Risk (03/31/2023)   Humiliation, Afraid, Rape, and Kick questionnaire    Fear of Current or Ex-Partner: No    Emotionally Abused: No    Physically Abused: No    Sexually Abused: No    Review of Systems: Positive for *** All other review of systems negative except as mentioned in the HPI.  Physical Exam: Vital signs There were no vitals taken for this visit.  General:   Alert,  Well-developed, well-nourished, pleasant and cooperative in NAD Lungs:  Clear throughout to auscultation.   Heart:  Regular rate and rhythm; no murmurs, clicks, rubs,  or gallops. Abdomen:  Soft, nontender and nondistended. Normal bowel sounds.   Neuro/Psych:  Alert and cooperative. Normal mood and affect. A and O x 3   @Jodye Scali   Tammie Fall, MD, [facility] [phone] (pager) 06/15/2023 11:37 AM@

## 2023-06-16 ENCOUNTER — Ambulatory Visit (AMBULATORY_SURGERY_CENTER): Admitting: Internal Medicine

## 2023-06-16 ENCOUNTER — Encounter: Payer: Self-pay | Admitting: Internal Medicine

## 2023-06-16 VITALS — BP 113/61 | HR 56 | Temp 97.3°F | Resp 16 | Ht 64.0 in | Wt 158.0 lb

## 2023-06-16 DIAGNOSIS — Z1211 Encounter for screening for malignant neoplasm of colon: Secondary | ICD-10-CM

## 2023-06-16 DIAGNOSIS — K635 Polyp of colon: Secondary | ICD-10-CM | POA: Diagnosis not present

## 2023-06-16 DIAGNOSIS — D122 Benign neoplasm of ascending colon: Secondary | ICD-10-CM

## 2023-06-16 DIAGNOSIS — E7800 Pure hypercholesterolemia, unspecified: Secondary | ICD-10-CM | POA: Diagnosis not present

## 2023-06-16 DIAGNOSIS — D120 Benign neoplasm of cecum: Secondary | ICD-10-CM | POA: Diagnosis not present

## 2023-06-16 DIAGNOSIS — G4730 Sleep apnea, unspecified: Secondary | ICD-10-CM | POA: Diagnosis not present

## 2023-06-16 MED ORDER — SODIUM CHLORIDE 0.9 % IV SOLN: 500.0000 mL | INTRAVENOUS | Status: DC

## 2023-06-16 NOTE — Progress Notes (Signed)
 Pt's states no medical or surgical changes since previsit or office visit.

## 2023-06-16 NOTE — Progress Notes (Signed)
 Sedate, gd SR, tolerated procedure well, VSS, report to RN

## 2023-06-16 NOTE — Op Note (Signed)
 [facility] Patient Name: Kelli Zavala Procedure Date: 06/16/2023 9:18 AM MRN: [id number] Endoscopist: Kenney Peacemaker , MD, [id number] Age: 73 Referring MD:  Date of Birth: 05-31-1950 Gender: Female Account #: [id number] Procedure:                Colonoscopy Indications:              Screening for colorectal malignant neoplasm, Last                            colonoscopy: 2015 Medicines:                Monitored Anesthesia Care Procedure:                Pre-Anesthesia Assessment:                           - Prior to the procedure, a History and Physical                            was performed, and patient medications and                            allergies were reviewed. The patient's tolerance of                            previous anesthesia was also reviewed. The risks                            and benefits of the procedure and the sedation                            options and risks were discussed with the patient.                            All questions were answered, and informed consent                            was obtained. Prior Anticoagulants: The patient has                            taken no anticoagulant or antiplatelet agents. ASA                            Grade Assessment: II - A patient with mild systemic                            disease. After reviewing the risks and benefits,                            the patient was deemed in satisfactory condition to                            undergo the procedure.  After obtaining informed consent, the colonoscope                            was passed under direct vision. Throughout the                            procedure, the patient's blood pressure, pulse, and                            oxygen saturations were monitored continuously. The                            Olympus Scope [id number] was introduced through the                            anus and advanced to the the cecum,  identified by                            appendiceal orifice and ileocecal valve. The                            colonoscopy was performed without difficulty. The                            patient tolerated the procedure well. The quality                            of the bowel preparation was good. The ileocecal                            valve, appendiceal orifice, and rectum were                            photographed. The bowel preparation used was SUPREP                            via split dose instruction. Scope In: 9:27:58 AM Scope Out: 9:43:57 AM Scope Withdrawal Time: 0 hours 13 minutes 11 seconds  Total Procedure Duration: 0 hours 15 minutes 59 seconds  Findings:                 The perianal and digital rectal examinations were                            normal.                           Two sessile polyps were found in the ascending                            colon and cecum. The polyps were 3 to 7 mm in size.                            These polyps were removed with a cold snare.  Resection and retrieval were complete. Verification                            of patient identification for the specimen was                            done. Estimated blood loss was minimal.                           The exam was otherwise without abnormality on                            direct and retroflexion views. Complications:            No immediate complications. Estimated Blood Loss:     Estimated blood loss was minimal. Impression:               - Two 3 to 7 mm polyps in the ascending colon and                            in the cecum, removed with a cold snare. Resected                            and retrieved.                           - The examination was otherwise normal on direct                            and retroflexion views. Recommendation:           - Patient has a contact number available for                            emergencies. The signs and  symptoms of potential                            delayed complications were discussed with the                            patient. Return to normal activities tomorrow.                            Written discharge instructions were provided to the                            patient.                           - Resume previous diet.                           - Continue present medications.                           - Await pathology results.                           -  No recommendation at this time regarding repeat                            colonoscopy due to age. Kenney Peacemaker, MD 06/16/2023 9:49:17 AM This report has been signed electronically.

## 2023-06-16 NOTE — Progress Notes (Signed)
 Called to room to assist during endoscopic procedure.  Patient ID and intended procedure confirmed with present staff. Received instructions for my participation in the procedure from the performing physician.

## 2023-06-16 NOTE — Patient Instructions (Addendum)
 I found and removed two small benign-appearing polyps today. Otherwise normal exam.  I will let you know pathology results and when or if to have another routine colonoscopy by mail and/or My Chart.  I appreciate the opportunity to care for you. Kenney Peacemaker, MD, FACG  YOU HAD AN ENDOSCOPIC PROCEDURE TODAY AT THE [facility]:   Refer to the procedure report that was given to you for any specific questions about what was found during the examination.  If the procedure report does not answer your questions, please call your gastroenterologist to clarify.  If you requested that your care partner not be given the details of your procedure findings, then the procedure report has been included in a sealed envelope for you to review at your convenience later.  YOU SHOULD EXPECT: Some feelings of bloating in the abdomen. Passage of more gas than usual.  Walking can help get rid of the air that was put into your GI tract during the procedure and reduce the bloating. If you had a lower endoscopy (such as a colonoscopy or flexible sigmoidoscopy) you may notice spotting of blood in your stool or on the toilet paper. If you underwent a bowel prep for your procedure, you may not have a normal bowel movement for a few days.  Please Note:  You might notice some irritation and congestion in your nose or some drainage.  This is from the oxygen used during your procedure.  There is no need for concern and it should clear up in a day or so.  SYMPTOMS TO REPORT IMMEDIATELY:  Following lower endoscopy (colonoscopy or flexible sigmoidoscopy):  Excessive amounts of blood in the stool  Significant tenderness or worsening of abdominal pains  Swelling of the abdomen that is new, acute  Fever of 100F or higher  Following upper endoscopy (EGD)  Vomiting of blood or coffee ground material  New chest pain or pain under the shoulder blades  Painful or persistently difficult swallowing  New shortness of  breath  Fever of 100F or higher  Black, tarry-looking stools  For urgent or emergent issues, a gastroenterologist can be reached at any hour by calling [phone]. Do not use MyChart messaging for urgent concerns.    DIET:  We do recommend a small meal at first, but then you may proceed to your regular diet.  Drink plenty of fluids but you should avoid alcoholic beverages for 24 hours.  ACTIVITY:  You should plan to take it easy for the rest of today and you should NOT DRIVE or use heavy machinery until tomorrow (because of the sedation medicines used during the test).    FOLLOW UP: Our staff will call the number listed on your records the next business day following your procedure.  We will call around 7:15- 8:00 am to check on you and address any questions or concerns that you may have regarding the information given to you following your procedure. If we do not reach you, we will leave a message.     If any biopsies were taken you will be contacted by phone or by letter within the next 1-3 weeks.  Please call us  at [phone] if you have not heard about the biopsies in 3 weeks.    SIGNATURES/CONFIDENTIALITY: You and/or your care partner have signed paperwork which will be entered into your electronic medical record.  These signatures attest to the fact that that the information above on your After Visit Summary has been reviewed and  is understood.  Full responsibility of the confidentiality of this discharge information lies with you and/or your care-partner.

## 2023-06-17 ENCOUNTER — Telehealth: Payer: Self-pay

## 2023-06-17 LAB — AMB RESULTS CONSOLE CBG: Glucose: 91

## 2023-06-17 NOTE — Telephone Encounter (Signed)
  Follow up Call-     06/16/2023    8:19 AM  Call back number  Post procedure Call Back phone  # [phone]  Permission to leave phone message Yes     Patient questions:  Do you have a fever, pain , or abdominal swelling? No. Pain Score  0 *  Have you tolerated food without any problems? Yes.    Have you been able to return to your normal activities? Yes.    Do you have any questions about your discharge instructions: Diet   No. Medications  No. Follow up visit  No.  Do you have questions or concerns about your Care? No.  Actions: * If pain score is 4 or above: No action needed, pain <4.

## 2023-06-17 NOTE — Progress Notes (Unsigned)
 Pt was seen on 06/17/2023 at screening event. Pt glucose was 91. Pt noted she does has a PCP and is not a smoker. Pt noted no SDOH needs at this time and no further instructions were given.

## 2023-06-18 LAB — SURGICAL PATHOLOGY

## 2023-06-20 ENCOUNTER — Ambulatory Visit: Payer: Self-pay | Admitting: Internal Medicine

## 2023-06-20 ENCOUNTER — Encounter: Payer: Self-pay | Admitting: Internal Medicine

## 2023-06-20 DIAGNOSIS — Z860100 Personal history of colon polyps, unspecified: Secondary | ICD-10-CM

## 2023-06-20 HISTORY — DX: Personal history of colon polyps, unspecified: Z86.0100

## 2023-06-26 ENCOUNTER — Encounter: Payer: Self-pay | Admitting: Family Medicine

## 2023-07-02 ENCOUNTER — Other Ambulatory Visit

## 2023-07-02 DIAGNOSIS — Z006 Encounter for examination for normal comparison and control in clinical research program: Secondary | ICD-10-CM

## 2023-07-11 LAB — GENECONNECT MOLECULAR SCREEN: Genetic Analysis Overall Interpretation: NEGATIVE

## 2023-08-04 DIAGNOSIS — G4733 Obstructive sleep apnea (adult) (pediatric): Secondary | ICD-10-CM | POA: Diagnosis not present

## 2023-09-03 DIAGNOSIS — G4733 Obstructive sleep apnea (adult) (pediatric): Secondary | ICD-10-CM | POA: Diagnosis not present

## 2023-09-08 DIAGNOSIS — H52223 Regular astigmatism, bilateral: Secondary | ICD-10-CM | POA: Diagnosis not present

## 2023-09-08 DIAGNOSIS — H25013 Cortical age-related cataract, bilateral: Secondary | ICD-10-CM | POA: Diagnosis not present

## 2023-09-08 DIAGNOSIS — H5213 Myopia, bilateral: Secondary | ICD-10-CM | POA: Diagnosis not present

## 2023-09-08 DIAGNOSIS — H2513 Age-related nuclear cataract, bilateral: Secondary | ICD-10-CM | POA: Diagnosis not present

## 2023-09-08 DIAGNOSIS — H1045 Other chronic allergic conjunctivitis: Secondary | ICD-10-CM | POA: Diagnosis not present

## 2023-09-08 DIAGNOSIS — H524 Presbyopia: Secondary | ICD-10-CM | POA: Diagnosis not present

## 2023-09-24 ENCOUNTER — Other Ambulatory Visit (INDEPENDENT_AMBULATORY_CARE_PROVIDER_SITE_OTHER)

## 2023-09-24 DIAGNOSIS — Z23 Encounter for immunization: Secondary | ICD-10-CM

## 2023-09-26 NOTE — Progress Notes (Signed)
 Pt attended 06/17/2023 screening event with blood sugar of 91. Pt noted at event that she does have a PCP. At event pt did not indicate any SDOH needs. Pt also noted that she is not a smoker and listed Medicare as her insurance at the event.   Per chart review pt does have a PCP Layman Fetters; [facility]), insurance, and is not a smoker. Pt's last appt with PCP was 3/17/205 and has an upcoming appt on 04/19/2024. Pt does not indicate any SDOH needs at this time.  No additional pt f/u to be scheduled at this time per health equity protocol.

## 2023-09-30 ENCOUNTER — Other Ambulatory Visit: Payer: Self-pay | Admitting: Family Medicine

## 2023-09-30 DIAGNOSIS — Z1231 Encounter for screening mammogram for malignant neoplasm of breast: Secondary | ICD-10-CM

## 2023-10-14 DIAGNOSIS — G4733 Obstructive sleep apnea (adult) (pediatric): Secondary | ICD-10-CM | POA: Diagnosis not present

## 2023-11-14 ENCOUNTER — Ambulatory Visit

## 2023-11-19 DIAGNOSIS — G4733 Obstructive sleep apnea (adult) (pediatric): Secondary | ICD-10-CM | POA: Diagnosis not present

## 2023-11-28 ENCOUNTER — Ambulatory Visit
Admission: RE | Admit: 2023-11-28 | Discharge: 2023-11-28 | Disposition: A | Discharge status: Home / Self Care | Source: Ambulatory Visit | Attending: Family Medicine | Admitting: Family Medicine

## 2023-11-28 DIAGNOSIS — Z1231 Encounter for screening mammogram for malignant neoplasm of breast: Secondary | ICD-10-CM

## 2024-01-09 ENCOUNTER — Encounter: Payer: Self-pay | Admitting: Family Medicine

## 2024-04-19 ENCOUNTER — Ambulatory Visit: Payer: Self-pay | Admitting: Family Medicine
